# Patient Record
Sex: Female | Born: 1945
Health system: Southern US, Community
[De-identification: ages and names within clinical notes are randomized; demographics above are authoritative.]

## PROBLEM LIST (undated history)

## (undated) DIAGNOSIS — Z803 Family history of malignant neoplasm of breast: Secondary | ICD-10-CM

## (undated) DIAGNOSIS — L570 Actinic keratosis: Secondary | ICD-10-CM

## (undated) HISTORY — DX: Actinic keratosis: L57.0

## (undated) HISTORY — DX: Family history of malignant neoplasm of breast: Z80.3

---

## 1999-06-12 ENCOUNTER — Encounter: Admission: RE | Admit: 1999-06-12 | Discharge: 1999-06-12 | Payer: Self-pay | Admitting: Internal Medicine

## 1999-06-12 ENCOUNTER — Encounter: Payer: Self-pay | Admitting: Internal Medicine

## 1999-08-24 ENCOUNTER — Other Ambulatory Visit: Admission: RE | Admit: 1999-08-24 | Discharge: 1999-08-24 | Payer: Self-pay | Admitting: Internal Medicine

## 2001-05-01 ENCOUNTER — Encounter: Payer: Self-pay | Admitting: Internal Medicine

## 2001-05-01 ENCOUNTER — Encounter: Admission: RE | Admit: 2001-05-01 | Discharge: 2001-05-01 | Payer: Self-pay | Admitting: Internal Medicine

## 2004-02-03 ENCOUNTER — Other Ambulatory Visit: Admission: RE | Admit: 2004-02-03 | Discharge: 2004-02-03 | Payer: Self-pay | Admitting: Internal Medicine

## 2004-04-20 ENCOUNTER — Emergency Department (HOSPITAL_COMMUNITY): Admission: EM | Admit: 2004-04-20 | Discharge: 2004-04-20 | Payer: Self-pay | Admitting: Emergency Medicine

## 2005-02-09 ENCOUNTER — Other Ambulatory Visit: Admission: RE | Admit: 2005-02-09 | Discharge: 2005-02-09 | Payer: Self-pay | Admitting: Internal Medicine

## 2006-03-28 ENCOUNTER — Other Ambulatory Visit: Admission: RE | Admit: 2006-03-28 | Discharge: 2006-03-28 | Payer: Self-pay | Admitting: Internal Medicine

## 2008-04-22 ENCOUNTER — Other Ambulatory Visit: Admission: RE | Admit: 2008-04-22 | Discharge: 2008-04-22 | Payer: Self-pay | Admitting: Family Medicine

## 2011-05-03 ENCOUNTER — Other Ambulatory Visit: Payer: Self-pay | Admitting: Internal Medicine

## 2011-05-03 ENCOUNTER — Other Ambulatory Visit (HOSPITAL_COMMUNITY)
Admission: RE | Admit: 2011-05-03 | Discharge: 2011-05-03 | Disposition: A | Discharge status: Home / Self Care | Payer: BC Managed Care – PPO | Source: Ambulatory Visit | Attending: Internal Medicine | Admitting: Internal Medicine

## 2011-05-03 DIAGNOSIS — Z1159 Encounter for screening for other viral diseases: Secondary | ICD-10-CM | POA: Insufficient documentation

## 2011-05-03 DIAGNOSIS — Z01419 Encounter for gynecological examination (general) (routine) without abnormal findings: Secondary | ICD-10-CM | POA: Insufficient documentation

## 2013-04-30 DIAGNOSIS — Z803 Family history of malignant neoplasm of breast: Secondary | ICD-10-CM | POA: Diagnosis not present

## 2013-04-30 DIAGNOSIS — Z1231 Encounter for screening mammogram for malignant neoplasm of breast: Secondary | ICD-10-CM | POA: Diagnosis not present

## 2013-06-10 DIAGNOSIS — M810 Age-related osteoporosis without current pathological fracture: Secondary | ICD-10-CM | POA: Diagnosis not present

## 2013-06-18 ENCOUNTER — Other Ambulatory Visit: Payer: Self-pay | Admitting: Internal Medicine

## 2013-06-18 ENCOUNTER — Other Ambulatory Visit (HOSPITAL_COMMUNITY)
Admission: RE | Admit: 2013-06-18 | Discharge: 2013-06-18 | Disposition: A | Discharge status: Home / Self Care | Payer: Medicare Other | Source: Ambulatory Visit | Attending: Internal Medicine | Admitting: Internal Medicine

## 2013-06-18 DIAGNOSIS — R7301 Impaired fasting glucose: Secondary | ICD-10-CM | POA: Diagnosis not present

## 2013-06-18 DIAGNOSIS — Z124 Encounter for screening for malignant neoplasm of cervix: Secondary | ICD-10-CM | POA: Insufficient documentation

## 2013-06-18 DIAGNOSIS — M899 Disorder of bone, unspecified: Secondary | ICD-10-CM | POA: Diagnosis not present

## 2013-06-18 DIAGNOSIS — Z0000 Encounter for general adult medical examination without abnormal findings: Secondary | ICD-10-CM | POA: Diagnosis not present

## 2013-06-18 DIAGNOSIS — Z136 Encounter for screening for cardiovascular disorders: Secondary | ICD-10-CM | POA: Diagnosis not present

## 2013-06-18 DIAGNOSIS — M949 Disorder of cartilage, unspecified: Secondary | ICD-10-CM | POA: Diagnosis not present

## 2013-06-18 DIAGNOSIS — Z1331 Encounter for screening for depression: Secondary | ICD-10-CM | POA: Diagnosis not present

## 2013-06-18 DIAGNOSIS — Z1211 Encounter for screening for malignant neoplasm of colon: Secondary | ICD-10-CM | POA: Diagnosis not present

## 2013-06-18 DIAGNOSIS — Z23 Encounter for immunization: Secondary | ICD-10-CM | POA: Diagnosis not present

## 2013-06-25 DIAGNOSIS — Z20828 Contact with and (suspected) exposure to other viral communicable diseases: Secondary | ICD-10-CM | POA: Diagnosis not present

## 2013-06-25 DIAGNOSIS — J069 Acute upper respiratory infection, unspecified: Secondary | ICD-10-CM | POA: Diagnosis not present

## 2013-07-28 ENCOUNTER — Other Ambulatory Visit: Payer: Self-pay | Admitting: Gastroenterology

## 2013-07-28 DIAGNOSIS — K5730 Diverticulosis of large intestine without perforation or abscess without bleeding: Secondary | ICD-10-CM | POA: Diagnosis not present

## 2013-07-28 DIAGNOSIS — D126 Benign neoplasm of colon, unspecified: Secondary | ICD-10-CM | POA: Diagnosis not present

## 2013-07-28 DIAGNOSIS — Z1211 Encounter for screening for malignant neoplasm of colon: Secondary | ICD-10-CM | POA: Diagnosis not present

## 2013-10-08 ENCOUNTER — Telehealth: Payer: Self-pay | Admitting: Genetic Counselor

## 2013-10-08 NOTE — Telephone Encounter (Signed)
S/W REFERRING OFFICE AND GAVE GENETIC APPT FOR 06/25 @ 9 W/GENETIC COUNSELOR.  REFERRING DR. SHAMLEFFER DX- FAM HX OF BREAST CA WELCOME PACKET MAILED.

## 2013-11-05 ENCOUNTER — Ambulatory Visit (HOSPITAL_BASED_OUTPATIENT_CLINIC_OR_DEPARTMENT_OTHER): Payer: Medicare Other | Admitting: Genetic Counselor

## 2013-11-05 ENCOUNTER — Other Ambulatory Visit: Payer: Medicare Other

## 2013-11-05 ENCOUNTER — Encounter: Payer: Self-pay | Admitting: Genetic Counselor

## 2013-11-05 DIAGNOSIS — Z803 Family history of malignant neoplasm of breast: Secondary | ICD-10-CM | POA: Diagnosis not present

## 2013-11-05 NOTE — Progress Notes (Signed)
Patient Name: Ellen Blackburn Patient Age: 68 y.o. Encounter Date: 11/05/2013  Referring Physician: Philemon Kingdom) Amedeo Kinsman, MD  Primary Care Ellen Blackburn: Ellen Blackburn, Ellen Lorenzo, MD   Ms. Ellen Blackburn, a 68 y.o. female, is being seen at the [facility] due to a family history of breast cancer.  She presents to clinic today to discuss the possibility of a hereditary predisposition to cancer and discuss whether genetic testing is warranted.  HISTORY OF PRESENT ILLNESS: Ms. Ellen Blackburn has no personal history of cancer and is in generally good health. She reports having a yearly mammogram, clinical breast exam and gynecologic exam. Her colonoscopy this year was negative for polyps. She states she had one polyp removed ~2010.  Past Medical History  Diagnosis Date  . Family history of malignant neoplasm of breast     History   Social History  . Marital Status: Married    Spouse Name: N/A    Number of Children: N/A  . Years of Education: N/A   Social History Main Topics  . Smoking status: Not on file  . Smokeless tobacco: Not on file  . Alcohol Use: Not on file  . Drug Use: Not on file  . Sexual Activity: Not on file   Other Topics Concern  . Not on file   Social History Narrative  . No narrative on file     FAMILY HISTORY:   During the visit, a 4-generation pedigree was obtained. Significant diagnoses include the following:  Family History  Problem Relation Age of Onset  . Breast cancer Mother     dx 67s; deceased 86  . Breast cancer Sister 73    currently 94  . Breast cancer Maternal Grandmother     dx age?; deceased 49s  . Breast cancer Sister 94    currently 5; intellectual disability  . Breast cancer Other 74    Niece; bilateral BC; daughter of sister w/ BC at 40    Additionally, Ms. Ellen Blackburn has a daughter (age 47). In addition to her 2 sisters with breast cancer, she has 2 other sister and 2 other brothers (ages 1s-70s) who are all  cancer-free. Her mother had a brother and 2 sisters. One of these maternal aunts may have had cancer, but the primary site in unknown. She died at age 68.  Ms. Ellen Blackburn father died at 75, cancer-free. He had 4 sisters. One may have had lung cancer.  Ms. Ellen Blackburn ancestry is [place]. There is no known Jewish ancestry and no consanguinity.  ASSESSMENT AND PLAN: Ms. Ellen Blackburn is a 68 y.o. female with a family history of breast cancer. While there are reports of breast cancers in 4 generations, this history is not highly suggestive of a hereditary predisposition to cancer due to the ages of diagnosis of her sisters, mother and maternal grandmother. The only early age-onset breast cancer is in her niece. It is unclear whether this niece has a paternal family history of cancer contributing to her genetic risk. We also explained that the fact that Ms. Ellen Blackburn is 68 years old and cancer-free is reassuring and lowers the likelihood significantly that she has an inherited breast cancer risk. We reviewed the characteristics, features and inheritance patterns of hereditary cancer syndromes. We also discussed genetic testing, including the appropriate family members to test, the process of testing, insurance coverage and implications of results.   Ms. Ellen Blackburn wished to pursue genetic testing and a blood sample will be sent to [facility] for analysis of the BRCA1,  BRCA2 and PALB2 genes. This lab was selected since her insurance is unlikely to cover testing costs and this lab will not balance-bill her. We discussed the implications of a positive, negative and/ or Variant of Uncertain Significance (VUS) result. Results should be available in approximately 3 weeks, at which point we will contact her and address implications for her as well as address genetic testing for at-risk family members, if needed.    Regardless of the results of Ms. Ellen Blackburn's test, we recommended her niece, who was diagnosed with breast cancer  at age 7, have genetic counseling and testing. We discussed that it is always most informative to initiate genetic testing in a family member diagnosed with cancer in that it can sometimes help [place] better interpret results for unaffected family members. Ms. Ellen Blackburn will speak with this family member and let us know if we can be of any assistance in coordinating genetic counseling and/or testing. In the meantime, we recommended Ms. Ellen Blackburn continue to follow the cancer screening guidelines given to he by her primary Ellen Blackburn.  We encouraged Ms. Ellen Blackburn to remain in contact with Cancer Genetics annually so that we can update the family history and inform her of any changes in cancer genetics and testing that may be of benefit for this family. Ms.  Ellen Blackburn questions were answered to her satisfaction today.   Thank you for the referral and allowing [place] to share in the care of your patient.   The patient was seen for a total of 30 minutes, greater than 50% of which was spent face-to-face counseling. This patient was discussed with the overseeing Jye Fariss who agrees with the above.

## 2013-11-17 DIAGNOSIS — L578 Other skin changes due to chronic exposure to nonionizing radiation: Secondary | ICD-10-CM | POA: Diagnosis not present

## 2013-11-17 DIAGNOSIS — L820 Inflamed seborrheic keratosis: Secondary | ICD-10-CM | POA: Diagnosis not present

## 2013-11-17 DIAGNOSIS — L570 Actinic keratosis: Secondary | ICD-10-CM | POA: Diagnosis not present

## 2013-11-17 DIAGNOSIS — Z85828 Personal history of other malignant neoplasm of skin: Secondary | ICD-10-CM | POA: Diagnosis not present

## 2013-11-17 DIAGNOSIS — L821 Other seborrheic keratosis: Secondary | ICD-10-CM | POA: Diagnosis not present

## 2013-11-23 ENCOUNTER — Encounter: Payer: Self-pay | Admitting: Genetic Counselor

## 2013-11-23 NOTE — Progress Notes (Signed)
Referring Physician: Abby Jaralla, MD   Ms. Wieczorek was called today to discuss genetic test results. Please see the Genetics note from her visit on 11/05/13 for a detailed discussion of her personal and family history.  GENETIC TESTING: At the time of Ms. Quesada's visit, we recommended she pursue genetic testing of the BRCA1, BRCA2 and PALB2 genes. This test, which included sequencing and deletion/duplication analysis, was performed at [facility] due to insurance reasons. Testing was normal and did not reveal a mutation in these genes.   We discussed with Ms. Kontz that since the current test is not perfect, it is possible there may be a gene mutation that current testing cannot detect, but that chance is small. We also discussed that it is possible that a different genetic factor, which was not part of this testing or has not yet been discovered, is responsible for the cancer diagnoses in the family. Should Ms. Milstein wish to discuss or pursue this additional testing, we are happy to coordinate this at any time, but do not feel that she is at significant risk of harboring a mutation in a different gene. What is most likely is that there is a detectable mutation in her family that she did not inherit given that she is cancer-free at age 67.   CANCER SCREENING: This normal result is generally reassuring and indicates that Ms. Krantz does not likely have an increased risk of cancer due to a mutation in one of these genes. She is aware that her risk is elevated above the baseline population risk due to her family history. We recommended Ms. Tangredi continue to follow the cancer screening guidelines provided by her primary physician.   FAMILY MEMBERS: While these results are reassuring for Ms. Saladin, this test does not tell us anything about Ms. Neumeister's sisters' genetic status. We recommended these relatives, and her niece who had bilateral breast cancer in her 40s, also have genetic counseling and  testing. Please let us know if we can help facilitate testing. Genetic counselors can be located in other cities, by visiting the website of the National Society of Genetic Counselors (www.nsgc.org) and searching for a genetic counselor by zip code.    Lastly, we discussed with Ms. Eastmond that cancer genetics is a rapidly advancing field and it is possible that new genetic tests will be appropriate for her in the future. We encouraged her to remain in contact with us on an annual basis so we can update her personal and family histories, and let her know of advances in cancer genetics that may benefit the family. Our contact number was provided. Ms. Noboa's questions were answered to her satisfaction today, and she knows she is welcome to call anytime with additional questions.    Ofri Leitner, MS, CGC Certified Genetic Counseor phone: [phone] [email or web address] 

## 2014-03-03 DIAGNOSIS — L570 Actinic keratosis: Secondary | ICD-10-CM | POA: Diagnosis not present

## 2014-03-03 DIAGNOSIS — L820 Inflamed seborrheic keratosis: Secondary | ICD-10-CM | POA: Diagnosis not present

## 2014-03-03 DIAGNOSIS — L578 Other skin changes due to chronic exposure to nonionizing radiation: Secondary | ICD-10-CM | POA: Diagnosis not present

## 2014-03-03 DIAGNOSIS — L821 Other seborrheic keratosis: Secondary | ICD-10-CM | POA: Diagnosis not present

## 2014-03-03 DIAGNOSIS — Z85828 Personal history of other malignant neoplasm of skin: Secondary | ICD-10-CM | POA: Diagnosis not present

## 2014-05-03 DIAGNOSIS — Z1231 Encounter for screening mammogram for malignant neoplasm of breast: Secondary | ICD-10-CM | POA: Diagnosis not present

## 2014-06-21 DIAGNOSIS — H43811 Vitreous degeneration, right eye: Secondary | ICD-10-CM | POA: Diagnosis not present

## 2014-06-21 DIAGNOSIS — H04122 Dry eye syndrome of left lacrimal gland: Secondary | ICD-10-CM | POA: Diagnosis not present

## 2014-06-21 DIAGNOSIS — H524 Presbyopia: Secondary | ICD-10-CM | POA: Diagnosis not present

## 2014-06-21 DIAGNOSIS — H5203 Hypermetropia, bilateral: Secondary | ICD-10-CM | POA: Diagnosis not present

## 2014-07-13 DIAGNOSIS — J069 Acute upper respiratory infection, unspecified: Secondary | ICD-10-CM | POA: Diagnosis not present

## 2014-07-20 DIAGNOSIS — E785 Hyperlipidemia, unspecified: Secondary | ICD-10-CM | POA: Diagnosis not present

## 2014-07-20 DIAGNOSIS — Z1389 Encounter for screening for other disorder: Secondary | ICD-10-CM | POA: Diagnosis not present

## 2014-07-20 DIAGNOSIS — R05 Cough: Secondary | ICD-10-CM | POA: Diagnosis not present

## 2014-07-20 DIAGNOSIS — Z0000 Encounter for general adult medical examination without abnormal findings: Secondary | ICD-10-CM | POA: Diagnosis not present

## 2014-07-20 DIAGNOSIS — Z23 Encounter for immunization: Secondary | ICD-10-CM | POA: Diagnosis not present

## 2014-08-21 DIAGNOSIS — J208 Acute bronchitis due to other specified organisms: Secondary | ICD-10-CM | POA: Diagnosis not present

## 2014-09-01 DIAGNOSIS — L820 Inflamed seborrheic keratosis: Secondary | ICD-10-CM | POA: Diagnosis not present

## 2014-09-01 DIAGNOSIS — L821 Other seborrheic keratosis: Secondary | ICD-10-CM | POA: Diagnosis not present

## 2014-09-01 DIAGNOSIS — Z85828 Personal history of other malignant neoplasm of skin: Secondary | ICD-10-CM | POA: Diagnosis not present

## 2014-09-01 DIAGNOSIS — L578 Other skin changes due to chronic exposure to nonionizing radiation: Secondary | ICD-10-CM | POA: Diagnosis not present

## 2014-09-01 DIAGNOSIS — L570 Actinic keratosis: Secondary | ICD-10-CM | POA: Diagnosis not present

## 2014-09-16 ENCOUNTER — Other Ambulatory Visit: Payer: Self-pay | Admitting: Internal Medicine

## 2014-09-16 ENCOUNTER — Ambulatory Visit
Admission: RE | Admit: 2014-09-16 | Discharge: 2014-09-16 | Disposition: A | Discharge status: Home / Self Care | Payer: Medicare Other | Source: Ambulatory Visit | Attending: Internal Medicine | Admitting: Internal Medicine

## 2014-09-16 ENCOUNTER — Other Ambulatory Visit (HOSPITAL_COMMUNITY): Payer: Self-pay | Admitting: Respiratory Therapy

## 2014-09-16 DIAGNOSIS — H43812 Vitreous degeneration, left eye: Secondary | ICD-10-CM | POA: Diagnosis not present

## 2014-09-16 DIAGNOSIS — R05 Cough: Secondary | ICD-10-CM

## 2014-09-16 DIAGNOSIS — J189 Pneumonia, unspecified organism: Secondary | ICD-10-CM | POA: Diagnosis not present

## 2014-09-16 DIAGNOSIS — R053 Chronic cough: Secondary | ICD-10-CM

## 2014-09-16 DIAGNOSIS — R0602 Shortness of breath: Secondary | ICD-10-CM | POA: Diagnosis not present

## 2014-09-16 DIAGNOSIS — R059 Cough, unspecified: Secondary | ICD-10-CM

## 2014-09-16 DIAGNOSIS — H43392 Other vitreous opacities, left eye: Secondary | ICD-10-CM | POA: Diagnosis not present

## 2014-09-30 ENCOUNTER — Ambulatory Visit (HOSPITAL_COMMUNITY)
Admission: RE | Admit: 2014-09-30 | Discharge: 2014-09-30 | Disposition: A | Discharge status: Home / Self Care | Payer: Medicare Other | Source: Ambulatory Visit | Attending: Internal Medicine | Admitting: Internal Medicine

## 2014-09-30 DIAGNOSIS — R05 Cough: Secondary | ICD-10-CM | POA: Insufficient documentation

## 2014-09-30 MED ORDER — ALBUTEROL SULFATE (2.5 MG/3ML) 0.083% IN NEBU
2.5000 mg | INHALATION_SOLUTION | Freq: Once | RESPIRATORY_TRACT | Status: AC
  Administered 2014-09-30: 2.5 mg via RESPIRATORY_TRACT

## 2014-10-05 LAB — PULMONARY FUNCTION TEST
DL/VA % pred: 78 %
DL/VA: 3.75 ml/min/mmHg/L
DLCO UNC: 18.19 ml/min/mmHg
DLCO unc % pred: 74 %
FEF 25-75 PRE: 1.69 L/s
FEF 25-75 Post: 2.24 L/sec
FEF2575-%Change-Post: 32 %
FEF2575-%PRED-PRE: 81 %
FEF2575-%Pred-Post: 108 %
FEV1-%CHANGE-POST: 7 %
FEV1-%PRED-PRE: 94 %
FEV1-%Pred-Post: 101 %
FEV1-PRE: 2.24 L
FEV1-Post: 2.41 L
FEV1FVC-%Change-Post: 5 %
FEV1FVC-%Pred-Pre: 96 %
FEV6-%CHANGE-POST: 1 %
FEV6-%PRED-POST: 102 %
FEV6-%Pred-Pre: 100 %
FEV6-POST: 3.06 L
FEV6-PRE: 3 L
FEV6FVC-%Change-Post: 0 %
FEV6FVC-%Pred-Post: 104 %
FEV6FVC-%Pred-Pre: 104 %
FVC-%Change-Post: 1 %
FVC-%PRED-POST: 98 %
FVC-%Pred-Pre: 96 %
FVC-Post: 3.06 L
FVC-Pre: 3.01 L
POST FEV1/FVC RATIO: 79 %
Post FEV6/FVC ratio: 100 %
Pre FEV1/FVC ratio: 74 %
Pre FEV6/FVC Ratio: 100 %
RV % PRED: 130 %
RV: 2.77 L
TLC % pred: 112 %
TLC: 5.7 L

## 2014-10-07 ENCOUNTER — Other Ambulatory Visit: Payer: Self-pay | Admitting: Internal Medicine

## 2014-10-07 ENCOUNTER — Ambulatory Visit
Admission: RE | Admit: 2014-10-07 | Discharge: 2014-10-07 | Disposition: A | Discharge status: Home / Self Care | Payer: Medicare Other | Source: Ambulatory Visit | Attending: Internal Medicine | Admitting: Internal Medicine

## 2014-10-07 DIAGNOSIS — J189 Pneumonia, unspecified organism: Secondary | ICD-10-CM | POA: Diagnosis not present

## 2014-10-07 DIAGNOSIS — J320 Chronic maxillary sinusitis: Secondary | ICD-10-CM | POA: Diagnosis not present

## 2014-10-07 DIAGNOSIS — R0602 Shortness of breath: Secondary | ICD-10-CM | POA: Diagnosis not present

## 2014-10-07 DIAGNOSIS — R05 Cough: Secondary | ICD-10-CM | POA: Diagnosis not present

## 2015-03-02 DIAGNOSIS — L578 Other skin changes due to chronic exposure to nonionizing radiation: Secondary | ICD-10-CM | POA: Diagnosis not present

## 2015-03-02 DIAGNOSIS — L570 Actinic keratosis: Secondary | ICD-10-CM | POA: Diagnosis not present

## 2015-03-02 DIAGNOSIS — Z85828 Personal history of other malignant neoplasm of skin: Secondary | ICD-10-CM | POA: Diagnosis not present

## 2015-03-02 DIAGNOSIS — L820 Inflamed seborrheic keratosis: Secondary | ICD-10-CM | POA: Diagnosis not present

## 2015-05-26 DIAGNOSIS — M810 Age-related osteoporosis without current pathological fracture: Secondary | ICD-10-CM | POA: Diagnosis not present

## 2015-05-26 DIAGNOSIS — Z803 Family history of malignant neoplasm of breast: Secondary | ICD-10-CM | POA: Diagnosis not present

## 2015-05-26 DIAGNOSIS — Z1231 Encounter for screening mammogram for malignant neoplasm of breast: Secondary | ICD-10-CM | POA: Diagnosis not present

## 2015-06-24 DIAGNOSIS — M810 Age-related osteoporosis without current pathological fracture: Secondary | ICD-10-CM | POA: Diagnosis not present

## 2015-06-30 DIAGNOSIS — M810 Age-related osteoporosis without current pathological fracture: Secondary | ICD-10-CM | POA: Diagnosis not present

## 2015-07-22 DIAGNOSIS — Z0000 Encounter for general adult medical examination without abnormal findings: Secondary | ICD-10-CM | POA: Diagnosis not present

## 2015-07-22 DIAGNOSIS — E785 Hyperlipidemia, unspecified: Secondary | ICD-10-CM | POA: Diagnosis not present

## 2015-07-22 DIAGNOSIS — M810 Age-related osteoporosis without current pathological fracture: Secondary | ICD-10-CM | POA: Diagnosis not present

## 2015-07-22 DIAGNOSIS — Z1389 Encounter for screening for other disorder: Secondary | ICD-10-CM | POA: Diagnosis not present

## 2015-11-01 DIAGNOSIS — L578 Other skin changes due to chronic exposure to nonionizing radiation: Secondary | ICD-10-CM | POA: Diagnosis not present

## 2015-11-01 DIAGNOSIS — L570 Actinic keratosis: Secondary | ICD-10-CM | POA: Diagnosis not present

## 2015-11-01 DIAGNOSIS — Z85828 Personal history of other malignant neoplasm of skin: Secondary | ICD-10-CM | POA: Diagnosis not present

## 2015-12-28 DIAGNOSIS — M810 Age-related osteoporosis without current pathological fracture: Secondary | ICD-10-CM | POA: Diagnosis not present

## 2015-12-28 DIAGNOSIS — Z79899 Other long term (current) drug therapy: Secondary | ICD-10-CM | POA: Diagnosis not present

## 2015-12-28 DIAGNOSIS — E785 Hyperlipidemia, unspecified: Secondary | ICD-10-CM | POA: Diagnosis not present

## 2016-02-03 DIAGNOSIS — N390 Urinary tract infection, site not specified: Secondary | ICD-10-CM | POA: Diagnosis not present

## 2016-03-08 DIAGNOSIS — Z23 Encounter for immunization: Secondary | ICD-10-CM | POA: Diagnosis not present

## 2016-03-28 DIAGNOSIS — E785 Hyperlipidemia, unspecified: Secondary | ICD-10-CM | POA: Diagnosis not present

## 2016-03-28 DIAGNOSIS — M810 Age-related osteoporosis without current pathological fracture: Secondary | ICD-10-CM | POA: Diagnosis not present

## 2016-03-28 DIAGNOSIS — E559 Vitamin D deficiency, unspecified: Secondary | ICD-10-CM | POA: Diagnosis not present

## 2016-06-07 DIAGNOSIS — L821 Other seborrheic keratosis: Secondary | ICD-10-CM | POA: Diagnosis not present

## 2016-06-07 DIAGNOSIS — Z85828 Personal history of other malignant neoplasm of skin: Secondary | ICD-10-CM | POA: Diagnosis not present

## 2016-06-07 DIAGNOSIS — L578 Other skin changes due to chronic exposure to nonionizing radiation: Secondary | ICD-10-CM | POA: Diagnosis not present

## 2016-06-07 DIAGNOSIS — H5203 Hypermetropia, bilateral: Secondary | ICD-10-CM | POA: Diagnosis not present

## 2016-06-07 DIAGNOSIS — L812 Freckles: Secondary | ICD-10-CM | POA: Diagnosis not present

## 2016-06-07 DIAGNOSIS — L570 Actinic keratosis: Secondary | ICD-10-CM | POA: Diagnosis not present

## 2016-06-07 DIAGNOSIS — H43812 Vitreous degeneration, left eye: Secondary | ICD-10-CM | POA: Diagnosis not present

## 2016-06-07 DIAGNOSIS — H2513 Age-related nuclear cataract, bilateral: Secondary | ICD-10-CM | POA: Diagnosis not present

## 2016-06-11 DIAGNOSIS — Z1231 Encounter for screening mammogram for malignant neoplasm of breast: Secondary | ICD-10-CM | POA: Diagnosis not present

## 2016-06-11 DIAGNOSIS — Z803 Family history of malignant neoplasm of breast: Secondary | ICD-10-CM | POA: Diagnosis not present

## 2016-07-03 DIAGNOSIS — J069 Acute upper respiratory infection, unspecified: Secondary | ICD-10-CM | POA: Diagnosis not present

## 2016-07-03 DIAGNOSIS — J019 Acute sinusitis, unspecified: Secondary | ICD-10-CM | POA: Diagnosis not present

## 2016-07-10 DIAGNOSIS — M810 Age-related osteoporosis without current pathological fracture: Secondary | ICD-10-CM | POA: Diagnosis not present

## 2016-07-23 DIAGNOSIS — M179 Osteoarthritis of knee, unspecified: Secondary | ICD-10-CM | POA: Diagnosis not present

## 2016-07-23 DIAGNOSIS — E559 Vitamin D deficiency, unspecified: Secondary | ICD-10-CM | POA: Diagnosis not present

## 2016-07-23 DIAGNOSIS — I7300 Raynaud's syndrome without gangrene: Secondary | ICD-10-CM | POA: Diagnosis not present

## 2016-07-23 DIAGNOSIS — M810 Age-related osteoporosis without current pathological fracture: Secondary | ICD-10-CM | POA: Diagnosis not present

## 2016-07-23 DIAGNOSIS — Z1389 Encounter for screening for other disorder: Secondary | ICD-10-CM | POA: Diagnosis not present

## 2016-07-23 DIAGNOSIS — E785 Hyperlipidemia, unspecified: Secondary | ICD-10-CM | POA: Diagnosis not present

## 2016-07-23 DIAGNOSIS — Z0000 Encounter for general adult medical examination without abnormal findings: Secondary | ICD-10-CM | POA: Diagnosis not present

## 2016-08-08 DIAGNOSIS — R1030 Lower abdominal pain, unspecified: Secondary | ICD-10-CM | POA: Diagnosis not present

## 2016-08-08 DIAGNOSIS — W19XXXA Unspecified fall, initial encounter: Secondary | ICD-10-CM | POA: Diagnosis not present

## 2016-10-24 DIAGNOSIS — E785 Hyperlipidemia, unspecified: Secondary | ICD-10-CM | POA: Diagnosis not present

## 2016-10-24 DIAGNOSIS — L539 Erythematous condition, unspecified: Secondary | ICD-10-CM | POA: Diagnosis not present

## 2016-10-24 DIAGNOSIS — W57XXXA Bitten or stung by nonvenomous insect and other nonvenomous arthropods, initial encounter: Secondary | ICD-10-CM | POA: Diagnosis not present

## 2016-10-26 IMAGING — CR DG CHEST 2V
2 series · 2 of 2 positions shown · non-contrast
Comparison: CTA chest dated 04/20/2004

CLINICAL DATA: Chronic cough x2 months, shortness of breath

EXAM:
CHEST  2 VIEW

[w chest pa]
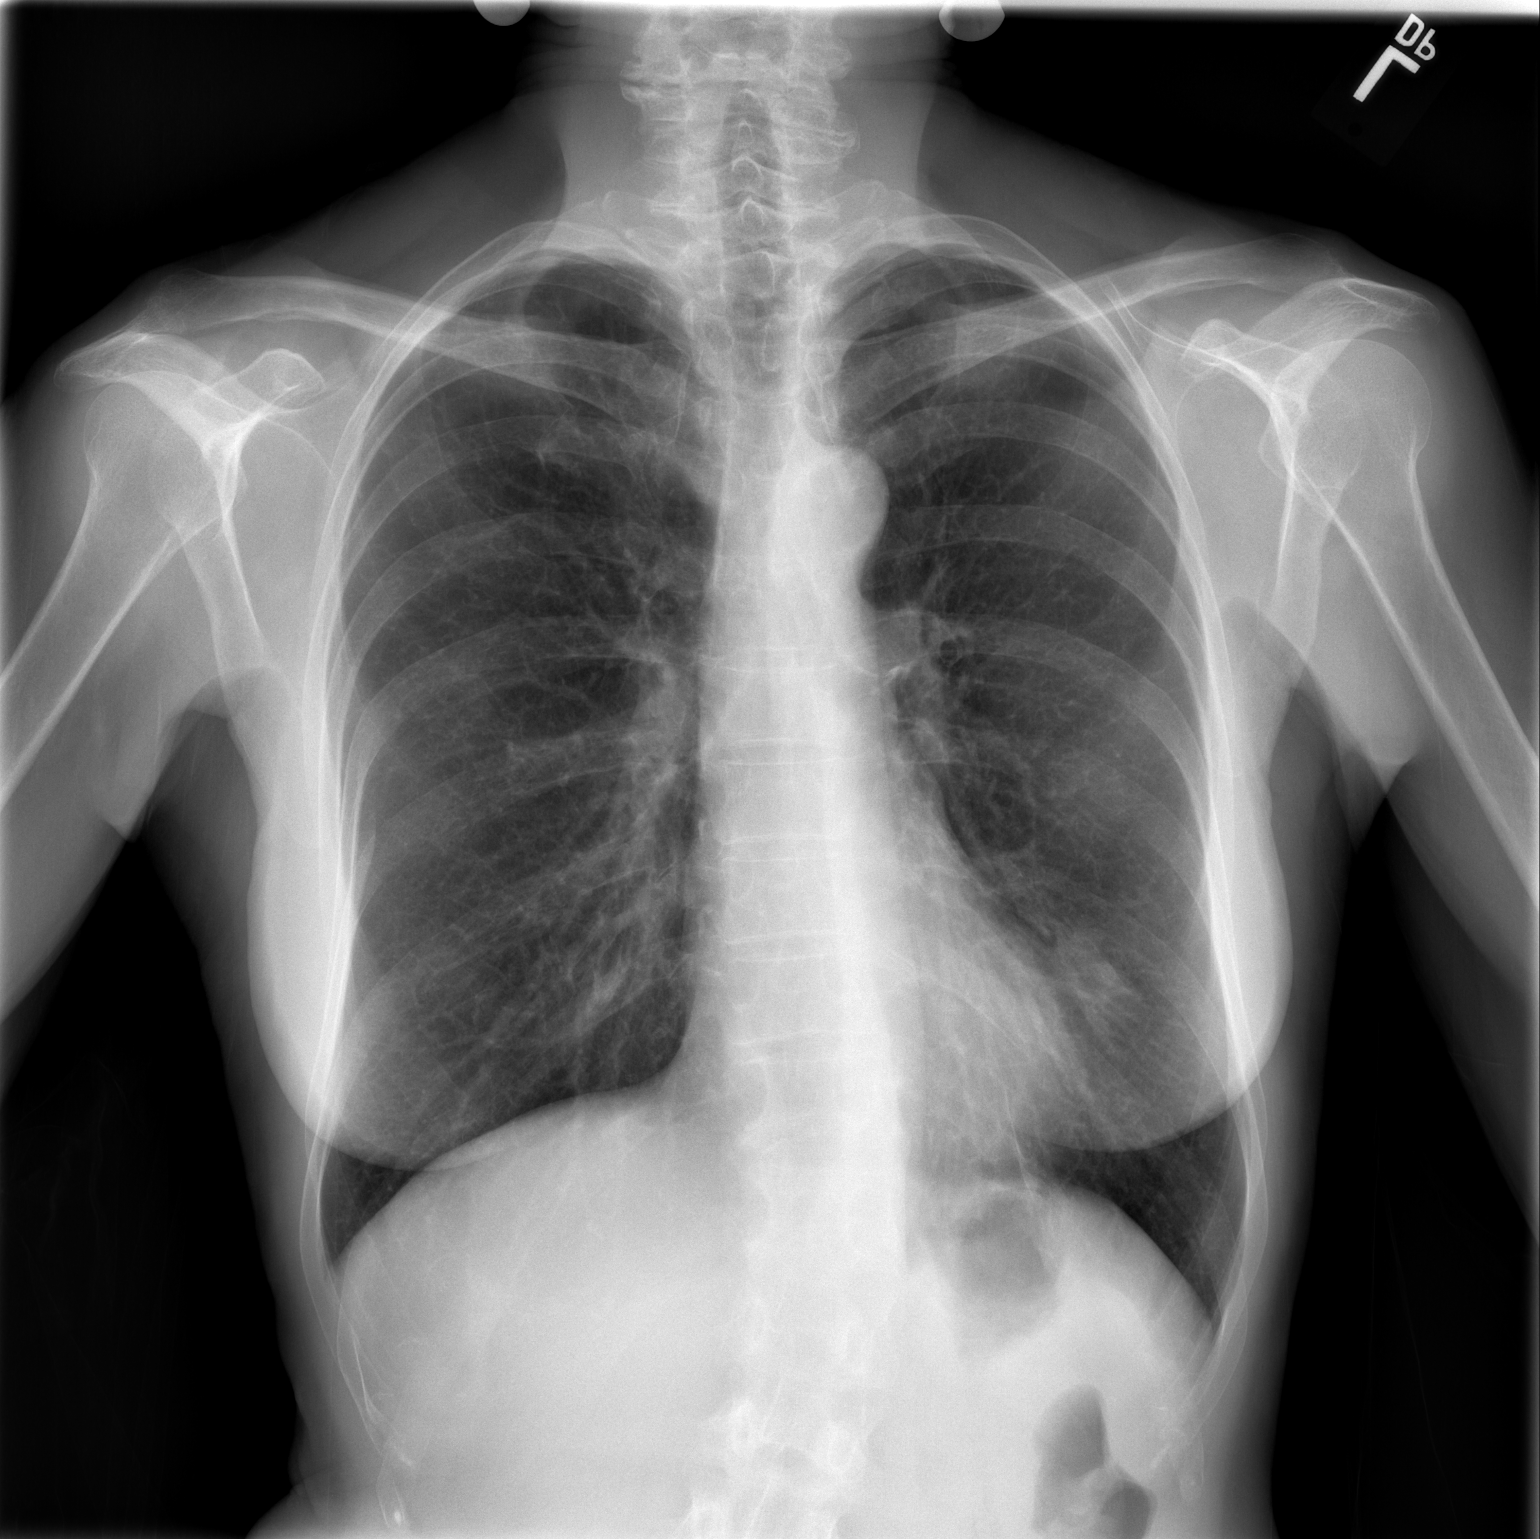

[w chest lat]
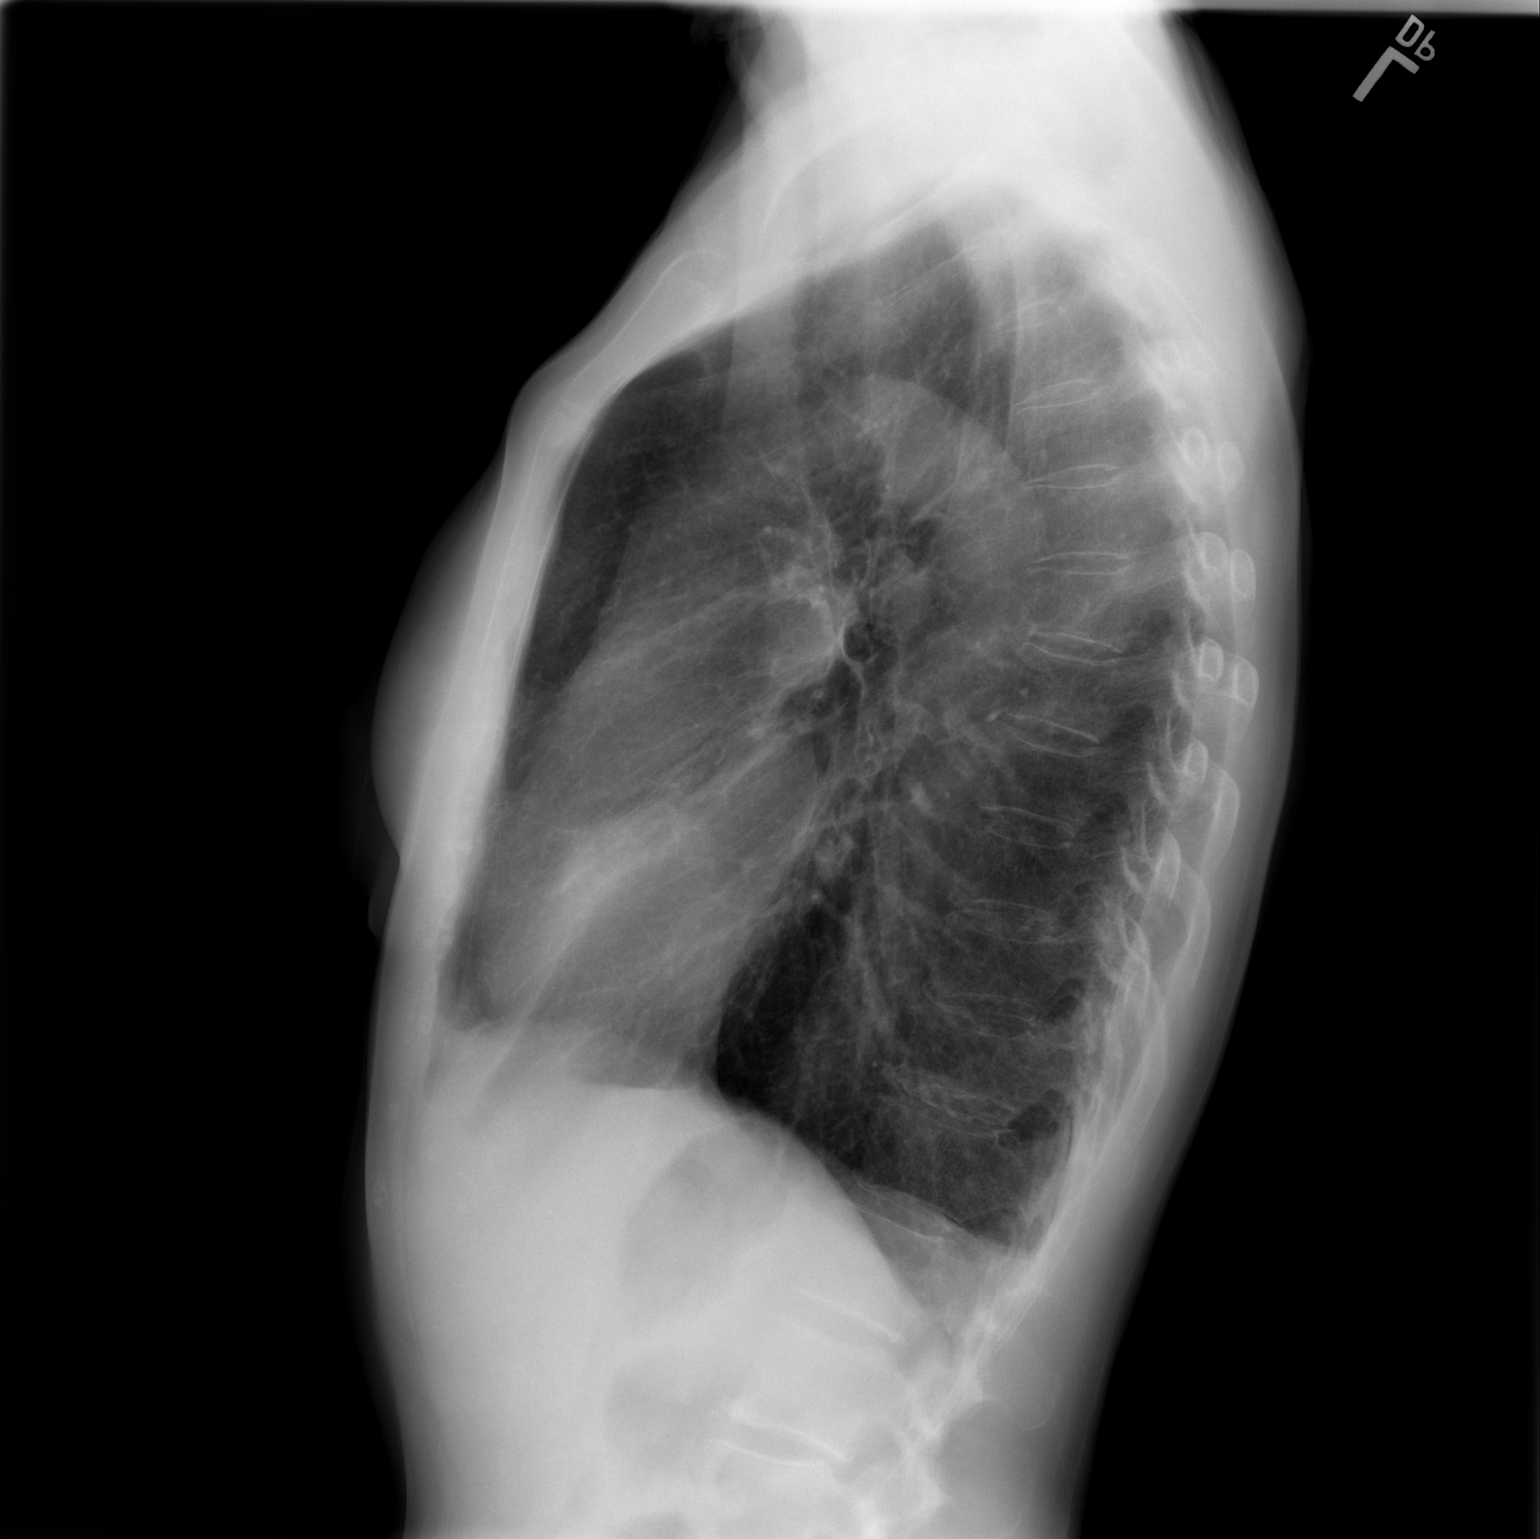

[2 of 2 positions shown; findings below may reference images not displayed]

FINDINGS: Patchy/ nodular lingular opacity, possibly infectious.

Lungs otherwise clear.  No pleural effusion or pneumothorax.

The heart is normal in size.

Visualized osseous structures are within normal limits.
IMPRESSION: Patchy/nodular lingular opacity, suspicious for pneumonia in the
appropriate clinical setting. Consider follow-up chest radiographs
in 3-4 weeks. If persistent, consider CT chest for further
evaluation.

## 2016-11-29 DIAGNOSIS — M25561 Pain in right knee: Secondary | ICD-10-CM | POA: Diagnosis not present

## 2016-11-29 DIAGNOSIS — M7631 Iliotibial band syndrome, right leg: Secondary | ICD-10-CM | POA: Diagnosis not present

## 2016-12-19 DIAGNOSIS — L578 Other skin changes due to chronic exposure to nonionizing radiation: Secondary | ICD-10-CM | POA: Diagnosis not present

## 2016-12-19 DIAGNOSIS — L821 Other seborrheic keratosis: Secondary | ICD-10-CM | POA: Diagnosis not present

## 2016-12-19 DIAGNOSIS — D229 Melanocytic nevi, unspecified: Secondary | ICD-10-CM | POA: Diagnosis not present

## 2016-12-19 DIAGNOSIS — Z85828 Personal history of other malignant neoplasm of skin: Secondary | ICD-10-CM | POA: Diagnosis not present

## 2016-12-19 DIAGNOSIS — L570 Actinic keratosis: Secondary | ICD-10-CM | POA: Diagnosis not present

## 2016-12-19 DIAGNOSIS — D692 Other nonthrombocytopenic purpura: Secondary | ICD-10-CM | POA: Diagnosis not present

## 2016-12-19 DIAGNOSIS — D1800 Hemangioma unspecified site: Secondary | ICD-10-CM | POA: Diagnosis not present

## 2016-12-19 DIAGNOSIS — L812 Freckles: Secondary | ICD-10-CM | POA: Diagnosis not present

## 2016-12-19 DIAGNOSIS — Z1283 Encounter for screening for malignant neoplasm of skin: Secondary | ICD-10-CM | POA: Diagnosis not present

## 2017-01-10 DIAGNOSIS — M810 Age-related osteoporosis without current pathological fracture: Secondary | ICD-10-CM | POA: Diagnosis not present

## 2017-01-10 DIAGNOSIS — M65331 Trigger finger, right middle finger: Secondary | ICD-10-CM | POA: Diagnosis not present

## 2017-01-10 DIAGNOSIS — R350 Frequency of micturition: Secondary | ICD-10-CM | POA: Diagnosis not present

## 2017-01-10 DIAGNOSIS — M1711 Unilateral primary osteoarthritis, right knee: Secondary | ICD-10-CM | POA: Diagnosis not present

## 2017-01-22 DIAGNOSIS — M1711 Unilateral primary osteoarthritis, right knee: Secondary | ICD-10-CM | POA: Diagnosis not present

## 2017-01-29 DIAGNOSIS — Z23 Encounter for immunization: Secondary | ICD-10-CM | POA: Diagnosis not present

## 2017-01-29 DIAGNOSIS — M1711 Unilateral primary osteoarthritis, right knee: Secondary | ICD-10-CM | POA: Diagnosis not present

## 2017-02-08 DIAGNOSIS — M1711 Unilateral primary osteoarthritis, right knee: Secondary | ICD-10-CM | POA: Diagnosis not present

## 2017-02-22 ENCOUNTER — Encounter (HOSPITAL_COMMUNITY): Payer: Self-pay | Admitting: *Deleted

## 2017-02-22 ENCOUNTER — Other Ambulatory Visit: Payer: Self-pay | Admitting: Internal Medicine

## 2017-02-22 ENCOUNTER — Emergency Department (HOSPITAL_COMMUNITY)
Admission: EM | Admit: 2017-02-22 | Discharge: 2017-02-22 | Disposition: A | Discharge status: Home / Self Care | Payer: Medicare Other | Attending: Emergency Medicine | Admitting: Emergency Medicine

## 2017-02-22 ENCOUNTER — Emergency Department (HOSPITAL_COMMUNITY): Payer: Medicare Other

## 2017-02-22 ENCOUNTER — Ambulatory Visit
Admission: RE | Admit: 2017-02-22 | Discharge: 2017-02-22 | Disposition: A | Discharge status: Home / Self Care | Payer: Medicare Other | Source: Ambulatory Visit | Attending: Internal Medicine | Admitting: Internal Medicine

## 2017-02-22 DIAGNOSIS — W01198A Fall on same level from slipping, tripping and stumbling with subsequent striking against other object, initial encounter: Secondary | ICD-10-CM | POA: Insufficient documentation

## 2017-02-22 DIAGNOSIS — R52 Pain, unspecified: Secondary | ICD-10-CM

## 2017-02-22 DIAGNOSIS — M25511 Pain in right shoulder: Secondary | ICD-10-CM | POA: Insufficient documentation

## 2017-02-22 DIAGNOSIS — M25531 Pain in right wrist: Secondary | ICD-10-CM | POA: Diagnosis not present

## 2017-02-22 DIAGNOSIS — S6991XA Unspecified injury of right wrist, hand and finger(s), initial encounter: Secondary | ICD-10-CM | POA: Diagnosis not present

## 2017-02-22 DIAGNOSIS — Z79899 Other long term (current) drug therapy: Secondary | ICD-10-CM | POA: Insufficient documentation

## 2017-02-22 DIAGNOSIS — Y929 Unspecified place or not applicable: Secondary | ICD-10-CM | POA: Diagnosis not present

## 2017-02-22 DIAGNOSIS — S62114A Nondisplaced fracture of triquetrum [cuneiform] bone, right wrist, initial encounter for closed fracture: Secondary | ICD-10-CM | POA: Diagnosis not present

## 2017-02-22 DIAGNOSIS — M7989 Other specified soft tissue disorders: Secondary | ICD-10-CM | POA: Diagnosis not present

## 2017-02-22 DIAGNOSIS — S62111A Displaced fracture of triquetrum [cuneiform] bone, right wrist, initial encounter for closed fracture: Secondary | ICD-10-CM | POA: Diagnosis not present

## 2017-02-22 DIAGNOSIS — W19XXXA Unspecified fall, initial encounter: Secondary | ICD-10-CM | POA: Diagnosis not present

## 2017-02-22 DIAGNOSIS — Y998 Other external cause status: Secondary | ICD-10-CM | POA: Diagnosis not present

## 2017-02-22 DIAGNOSIS — Y939 Activity, unspecified: Secondary | ICD-10-CM | POA: Insufficient documentation

## 2017-02-22 MED ORDER — OXYCODONE-ACETAMINOPHEN 5-325 MG PO TABS
1.0000 | ORAL_TABLET | Freq: Once | ORAL | Status: AC
  Administered 2017-02-22: 1 via ORAL
  Filled 2017-02-22: qty 1

## 2017-02-22 MED ORDER — OXYCODONE-ACETAMINOPHEN 5-325 MG PO TABS: 1.0000 | ORAL_TABLET | Freq: Four times a day (QID) | ORAL | 0 refills | Status: DC | PRN

## 2017-02-22 NOTE — ED Triage Notes (Signed)
Pt reports falling last night landing on concrete, pt seen by PCP today with xrays revealing multiple R wrist fractures, recommended to come here by [facility] office to be seen, pt denies hitting head & LOC, denies taking blood thinners, A&O x4

## 2017-02-22 NOTE — Progress Notes (Signed)
Orthopedic Tech Progress Note Patient Details:  Ellen Blackburn 12-03-1945 [id number]  Ortho Devices Type of Ortho Device: Sugartong splint, Arm sling Ortho Device/Splint Location: Right Ortho Device/Splint Interventions: Application, Adjustment   Kristopher Oppenheim 02/22/2017, 6:50 PM

## 2017-02-22 NOTE — ED Notes (Signed)
Pt reports falling last night on wet ground. Was seen today and got Xrays. Pt states that her doctor told her she has multiple breaks.

## 2017-02-22 NOTE — ED Provider Notes (Signed)
[facility] DEPT Provider Note   CSN: [id number] Arrival date & time: 02/22/17  1329     History   Chief Complaint Chief Complaint  Patient presents with  . Wrist Pain    HPI Ellen Blackburn is a 71 y.o. female.  HPI Patient presents with right wrist pain after a fall last night. Sent in after being seen by [facility] clinic today. X-ray shows possible carpal fractures. Reportedly had discussed with Dr. Levell July office from hand surgery and was told to come in the ER. She slipped on something wet and landed on her right side. Broke her fall with her right hand. She is not on anticoagulation. She did eat some food out of the vending machine while she was waiting in the waiting room. Past Medical History:  Diagnosis Date  . Family history of malignant neoplasm of breast     Patient Active Problem List   Diagnosis Date Noted  . Family history of malignant neoplasm of breast     History reviewed. No pertinent surgical history.  OB History    No data available       Home Medications    Prior to Admission medications   Medication Sig Start Date End Date Taking? Authorizing Provider  Ascorbic Acid (VITAMIN C PO) Take 1 tablet by mouth daily.   Yes [provider]  Chlorpheniramine-Pseudoeph (ALLEREST MAXIMUM STRENGTH PO) Take 1 tablet by mouth daily.   Yes [provider]  Cholecalciferol (VITAMIN D PO) Take 1 capsule by mouth daily.   Yes [provider]  Omega-3 Fatty Acids (FISH OIL PO) Take 1 capsule by mouth daily.   Yes [provider]  simvastatin (ZOCOR) 10 MG tablet Take 10 mg by mouth daily as needed (cholesterol).   Yes [provider]  TURMERIC PO Take 1 capsule by mouth daily.   Yes [provider]  oxyCODONE-acetaminophen (PERCOCET/ROXICET) 5-325 MG tablet Take 1-2 tablets by mouth every 6 (six) hours as needed. 02/22/17   Davonna Belling, MD    Family History Family History  Problem Relation Age of  Onset  . Breast cancer Mother        dx 23s; deceased 50  . Breast cancer Sister 91       currently 34  . Breast cancer Maternal Grandmother        dx age?; deceased 39s  . Breast cancer Sister 103       currently 49; intellectual disability  . Breast cancer Other 37       Niece; bilateral BC; daughter of sister w/ BC at 26    Social History Social History  Substance Use Topics  . Smoking status: Never Smoker  . Smokeless tobacco: Never Used  . Alcohol use 4.2 oz/week    7 Glasses of wine per week     Allergies   Patient has no known allergies.   Review of Systems Review of Systems  Constitutional: Negative for appetite change.  Respiratory: Negative for shortness of breath.   Musculoskeletal: Negative for back pain and neck pain.       Right wrist and right shoulder pain.  Skin: Negative for wound.  Neurological: Negative for weakness and numbness.     Physical Exam Updated Vital Signs BP 110/71   Pulse (!) 53   Temp 97.6 F (36.4 C) (Oral)   Resp 18   Ht 5\' 4"  (1.626 m)   Wt 51.7 kg (114 lb)   SpO2 100%   BMI 19.57 kg/m  Physical Exam  Constitutional: She appears well-developed.  HENT:  Head: Atraumatic.  Eyes: Pupils are equal, round, and reactive to light.  Cardiovascular: Normal rate.   Musculoskeletal:  Minimal tenderness to right shoulder with good range of motion. No tenderness over elbow. Swelling on the dorsal aspect of the right wrist. Patient is in a Velcro splint. Neurovascular intact in fingers. Skin intact. Good capillary refill.  Neurological: She is alert.  Skin: Skin is warm. Capillary refill takes less than 2 seconds.     ED Treatments / Results  Labs (all labs ordered are listed, but only abnormal results are displayed) Labs Reviewed - No data to display  EKG  EKG Interpretation None       Radiology Dg Wrist Complete Right  Result Date: 02/22/2017 CLINICAL DATA:  Pt fell last night on concrete, pain and swelling  carpal bones EXAM: RIGHT WRIST - COMPLETE 3+ VIEW COMPARISON:  None. FINDINGS: There are several bony fragments versus soft tissue calcifications which project over the dorsal aspect of the carpus. There is associated dorsal soft tissue swelling. If these are fractures, the site of the fracture is not defined on these images. The dorsal triquetrum fracture as a component of this is the most likely. Alternatively, this may reflect nonspecific, chronic, soft tissue calcification. The soft tissue swelling with suggests that there is at least a component of acute fracture. No other evidence of a fracture. The joints are normally spaced and aligned with no significant arthropathic change. No other soft tissue abnormality. IMPRESSION: 1. Multiple bone fragments versus areas of soft tissue calcification along the dorsal margin of the carpus with associated dorsal soft tissue swelling. Given the history of the recent fall, suspect at least 1 of these is a dorsal triquetrum avulsion fracture. 2. No other evidence of a fracture.  No dislocation. Electronically Signed   By: Lajean Manes M.D.   On: 02/22/2017 12:38   Ct Wrist Right Wo Contrast  Result Date: 02/22/2017 CLINICAL DATA:  Right wrist pain and swelling since a fall last night. Initial encounter. EXAM: CT OF THE RIGHT WRIST WITHOUT CONTRAST TECHNIQUE: Multidetector CT imaging of the right wrist was performed according to the standard protocol. Multiplanar CT image reconstructions were also generated. COMPARISON:  Plain films right wrist 02/22/2017. FINDINGS: Bones/Joint/Cartilage The patient has a chip fracture off the dorsal aspect of the triquetrum. Fracture fragment measures 0.5 cm transverse by 0.3 cm AP by 0.6 cm long. The fracture fragment is dorsally displaced 0.2 cm. No other acute fracture is identified. There is a well corticated margin between the hook of the hamate and underlying hamate likely due to congenital failure of fusion. Mild osteophytosis  and joint space narrowing first CMC joint noted. Ligaments Suboptimally assessed by CT. Muscles and Tendons Appear intact. Soft tissues There is soft tissue swelling about the wrist. Focus of calcifications over the dorsal aspect of the wrist at the level of the articulation of the lunate and triquetrum measures 0.6 cm AP by 1.2 cm transverse by 0.7 cm craniocaudal. Somewhat focal appearing fluid is seen dorsal to capitate measuring 1 cm craniocaudal by 0.5 cm AP by approximately 1.8 cm transverse. IMPRESSION: The examination is positive for an acute fracture off the dorsal aspect of the triquetrum. No other acute bony abnormality is seen. Small cluster of calcifications in the dorsum of the wrist could be calcifications about a chronic ganglion cyst which is the favored diagnosis. Tophus related to gout is also possible. Findings compatible with a small  ganglion cyst centered over the dorsum of the capitate. Electronically Signed   By: Inge Rise M.D.   On: 02/22/2017 17:47    Procedures Procedures (including critical care time)  Medications Ordered in ED Medications  oxyCODONE-acetaminophen (PERCOCET/ROXICET) 5-325 MG per tablet 1 tablet (1 tablet Oral Given 02/22/17 1636)     Initial Impression / Assessment and Plan / ED Course  I have reviewed the triage vital signs and the nursing notes.  Pertinent labs & imaging results that were available during my care of the patient were reviewed by me and considered in my medical decision making (see chart for details).    Patient with wrist fracture. Has triquetral fracture. The calcifications are likely bone fragments appear to be calcifications on a ganglion cyst on CT. I have discussed with Dr. Fredna Dow, will see the patient follow up on Monday.*  Final Clinical Impressions(s) / ED Diagnoses   Final diagnoses:  Closed nondisplaced fracture of triquetrum of right wrist, initial encounter    New Prescriptions Discharge Medication List as of  02/22/2017  6:33 PM    START taking these medications   Details  oxyCODONE-acetaminophen (PERCOCET/ROXICET) 5-325 MG tablet Take 1-2 tablets by mouth every 6 (six) hours as needed., Starting Fri 02/22/2017, Print         Davonna Belling, MD 02/23/17 0030

## 2017-02-25 DIAGNOSIS — M67431 Ganglion, right wrist: Secondary | ICD-10-CM | POA: Diagnosis not present

## 2017-02-25 DIAGNOSIS — S62111A Displaced fracture of triquetrum [cuneiform] bone, right wrist, initial encounter for closed fracture: Secondary | ICD-10-CM | POA: Diagnosis not present

## 2017-02-25 DIAGNOSIS — M25521 Pain in right elbow: Secondary | ICD-10-CM | POA: Diagnosis not present

## 2017-03-01 DIAGNOSIS — M65331 Trigger finger, right middle finger: Secondary | ICD-10-CM | POA: Diagnosis not present

## 2017-03-01 DIAGNOSIS — S62114A Nondisplaced fracture of triquetrum [cuneiform] bone, right wrist, initial encounter for closed fracture: Secondary | ICD-10-CM | POA: Diagnosis not present

## 2017-03-15 DIAGNOSIS — M25531 Pain in right wrist: Secondary | ICD-10-CM | POA: Diagnosis not present

## 2017-03-15 DIAGNOSIS — S62111S Displaced fracture of triquetrum [cuneiform] bone, right wrist, sequela: Secondary | ICD-10-CM | POA: Diagnosis not present

## 2017-03-15 DIAGNOSIS — M65331 Trigger finger, right middle finger: Secondary | ICD-10-CM | POA: Diagnosis not present

## 2017-03-20 DIAGNOSIS — M1711 Unilateral primary osteoarthritis, right knee: Secondary | ICD-10-CM | POA: Diagnosis not present

## 2017-04-29 DIAGNOSIS — M65341 Trigger finger, right ring finger: Secondary | ICD-10-CM | POA: Diagnosis not present

## 2017-04-29 DIAGNOSIS — M65331 Trigger finger, right middle finger: Secondary | ICD-10-CM | POA: Diagnosis not present

## 2017-04-29 DIAGNOSIS — M65351 Trigger finger, right little finger: Secondary | ICD-10-CM | POA: Diagnosis not present

## 2017-05-15 DIAGNOSIS — M1711 Unilateral primary osteoarthritis, right knee: Secondary | ICD-10-CM | POA: Diagnosis not present

## 2017-05-15 DIAGNOSIS — M7631 Iliotibial band syndrome, right leg: Secondary | ICD-10-CM | POA: Diagnosis not present

## 2017-05-15 DIAGNOSIS — M25561 Pain in right knee: Secondary | ICD-10-CM | POA: Diagnosis not present

## 2017-05-16 DIAGNOSIS — M79641 Pain in right hand: Secondary | ICD-10-CM | POA: Diagnosis not present

## 2017-05-29 DIAGNOSIS — M79641 Pain in right hand: Secondary | ICD-10-CM | POA: Diagnosis not present

## 2017-06-21 DIAGNOSIS — Z1231 Encounter for screening mammogram for malignant neoplasm of breast: Secondary | ICD-10-CM | POA: Diagnosis not present

## 2017-06-21 DIAGNOSIS — Z803 Family history of malignant neoplasm of breast: Secondary | ICD-10-CM | POA: Diagnosis not present

## 2017-06-24 DIAGNOSIS — M79641 Pain in right hand: Secondary | ICD-10-CM | POA: Diagnosis not present

## 2017-07-15 DIAGNOSIS — M79641 Pain in right hand: Secondary | ICD-10-CM | POA: Diagnosis not present

## 2017-07-17 DIAGNOSIS — H524 Presbyopia: Secondary | ICD-10-CM | POA: Diagnosis not present

## 2017-07-17 DIAGNOSIS — H2513 Age-related nuclear cataract, bilateral: Secondary | ICD-10-CM | POA: Diagnosis not present

## 2017-07-17 DIAGNOSIS — H5203 Hypermetropia, bilateral: Secondary | ICD-10-CM | POA: Diagnosis not present

## 2017-07-24 DIAGNOSIS — N6019 Diffuse cystic mastopathy of unspecified breast: Secondary | ICD-10-CM | POA: Diagnosis not present

## 2017-07-24 DIAGNOSIS — Z1389 Encounter for screening for other disorder: Secondary | ICD-10-CM | POA: Diagnosis not present

## 2017-07-24 DIAGNOSIS — Z0000 Encounter for general adult medical examination without abnormal findings: Secondary | ICD-10-CM | POA: Diagnosis not present

## 2017-07-24 DIAGNOSIS — E785 Hyperlipidemia, unspecified: Secondary | ICD-10-CM | POA: Diagnosis not present

## 2017-07-24 DIAGNOSIS — I7300 Raynaud's syndrome without gangrene: Secondary | ICD-10-CM | POA: Diagnosis not present

## 2017-07-24 DIAGNOSIS — M65331 Trigger finger, right middle finger: Secondary | ICD-10-CM | POA: Diagnosis not present

## 2017-07-24 DIAGNOSIS — M810 Age-related osteoporosis without current pathological fracture: Secondary | ICD-10-CM | POA: Diagnosis not present

## 2017-07-24 DIAGNOSIS — M179 Osteoarthritis of knee, unspecified: Secondary | ICD-10-CM | POA: Diagnosis not present

## 2017-07-24 DIAGNOSIS — Z136 Encounter for screening for cardiovascular disorders: Secondary | ICD-10-CM | POA: Diagnosis not present

## 2017-08-01 DIAGNOSIS — M79641 Pain in right hand: Secondary | ICD-10-CM | POA: Diagnosis not present

## 2017-08-19 DIAGNOSIS — M65341 Trigger finger, right ring finger: Secondary | ICD-10-CM | POA: Diagnosis not present

## 2017-08-19 DIAGNOSIS — M6530 Trigger finger, unspecified finger: Secondary | ICD-10-CM | POA: Diagnosis not present

## 2017-08-19 DIAGNOSIS — M65331 Trigger finger, right middle finger: Secondary | ICD-10-CM | POA: Diagnosis not present

## 2017-12-31 DIAGNOSIS — K529 Noninfective gastroenteritis and colitis, unspecified: Secondary | ICD-10-CM | POA: Diagnosis not present

## 2018-01-08 DIAGNOSIS — L570 Actinic keratosis: Secondary | ICD-10-CM | POA: Diagnosis not present

## 2018-01-08 DIAGNOSIS — L578 Other skin changes due to chronic exposure to nonionizing radiation: Secondary | ICD-10-CM | POA: Diagnosis not present

## 2018-01-08 DIAGNOSIS — L820 Inflamed seborrheic keratosis: Secondary | ICD-10-CM | POA: Diagnosis not present

## 2018-01-08 DIAGNOSIS — D2230 Melanocytic nevi of unspecified part of face: Secondary | ICD-10-CM | POA: Diagnosis not present

## 2018-01-08 DIAGNOSIS — L812 Freckles: Secondary | ICD-10-CM | POA: Diagnosis not present

## 2018-01-08 DIAGNOSIS — D225 Melanocytic nevi of trunk: Secondary | ICD-10-CM | POA: Diagnosis not present

## 2018-01-08 DIAGNOSIS — D229 Melanocytic nevi, unspecified: Secondary | ICD-10-CM | POA: Diagnosis not present

## 2018-01-08 DIAGNOSIS — Z1283 Encounter for screening for malignant neoplasm of skin: Secondary | ICD-10-CM | POA: Diagnosis not present

## 2018-01-08 DIAGNOSIS — D1800 Hemangioma unspecified site: Secondary | ICD-10-CM | POA: Diagnosis not present

## 2018-01-08 DIAGNOSIS — L821 Other seborrheic keratosis: Secondary | ICD-10-CM | POA: Diagnosis not present

## 2018-01-08 DIAGNOSIS — Z85828 Personal history of other malignant neoplasm of skin: Secondary | ICD-10-CM | POA: Diagnosis not present

## 2018-01-24 DIAGNOSIS — Z23 Encounter for immunization: Secondary | ICD-10-CM | POA: Diagnosis not present

## 2018-01-24 DIAGNOSIS — M810 Age-related osteoporosis without current pathological fracture: Secondary | ICD-10-CM | POA: Diagnosis not present

## 2018-02-05 DIAGNOSIS — M180 Bilateral primary osteoarthritis of first carpometacarpal joints: Secondary | ICD-10-CM | POA: Diagnosis not present

## 2018-02-05 DIAGNOSIS — M79641 Pain in right hand: Secondary | ICD-10-CM | POA: Diagnosis not present

## 2018-02-05 DIAGNOSIS — M79642 Pain in left hand: Secondary | ICD-10-CM | POA: Diagnosis not present

## 2018-06-27 DIAGNOSIS — Z803 Family history of malignant neoplasm of breast: Secondary | ICD-10-CM | POA: Diagnosis not present

## 2018-06-27 DIAGNOSIS — Z1231 Encounter for screening mammogram for malignant neoplasm of breast: Secondary | ICD-10-CM | POA: Diagnosis not present

## 2018-07-21 DIAGNOSIS — H5203 Hypermetropia, bilateral: Secondary | ICD-10-CM | POA: Diagnosis not present

## 2018-07-21 DIAGNOSIS — H2513 Age-related nuclear cataract, bilateral: Secondary | ICD-10-CM | POA: Diagnosis not present

## 2018-08-04 DIAGNOSIS — M810 Age-related osteoporosis without current pathological fracture: Secondary | ICD-10-CM | POA: Diagnosis not present

## 2018-08-04 DIAGNOSIS — E785 Hyperlipidemia, unspecified: Secondary | ICD-10-CM | POA: Diagnosis not present

## 2018-08-04 DIAGNOSIS — I7300 Raynaud's syndrome without gangrene: Secondary | ICD-10-CM | POA: Diagnosis not present

## 2018-08-04 DIAGNOSIS — M179 Osteoarthritis of knee, unspecified: Secondary | ICD-10-CM | POA: Diagnosis not present

## 2018-08-04 DIAGNOSIS — Z79899 Other long term (current) drug therapy: Secondary | ICD-10-CM | POA: Diagnosis not present

## 2018-08-04 DIAGNOSIS — Z1211 Encounter for screening for malignant neoplasm of colon: Secondary | ICD-10-CM | POA: Diagnosis not present

## 2018-08-04 DIAGNOSIS — Z1239 Encounter for other screening for malignant neoplasm of breast: Secondary | ICD-10-CM | POA: Diagnosis not present

## 2018-08-04 DIAGNOSIS — Z0000 Encounter for general adult medical examination without abnormal findings: Secondary | ICD-10-CM | POA: Diagnosis not present

## 2018-09-16 DIAGNOSIS — M810 Age-related osteoporosis without current pathological fracture: Secondary | ICD-10-CM | POA: Diagnosis not present

## 2018-09-16 DIAGNOSIS — Z7189 Other specified counseling: Secondary | ICD-10-CM | POA: Diagnosis not present

## 2018-09-16 DIAGNOSIS — J309 Allergic rhinitis, unspecified: Secondary | ICD-10-CM | POA: Diagnosis not present

## 2018-09-16 DIAGNOSIS — Z1331 Encounter for screening for depression: Secondary | ICD-10-CM | POA: Diagnosis not present

## 2018-10-20 DIAGNOSIS — Z96653 Presence of artificial knee joint, bilateral: Secondary | ICD-10-CM | POA: Diagnosis not present

## 2018-10-20 DIAGNOSIS — M8589 Other specified disorders of bone density and structure, multiple sites: Secondary | ICD-10-CM | POA: Diagnosis not present

## 2018-10-20 DIAGNOSIS — Z8262 Family history of osteoporosis: Secondary | ICD-10-CM | POA: Diagnosis not present

## 2019-01-06 DIAGNOSIS — Z23 Encounter for immunization: Secondary | ICD-10-CM | POA: Diagnosis not present

## 2019-02-10 DIAGNOSIS — M810 Age-related osteoporosis without current pathological fracture: Secondary | ICD-10-CM | POA: Diagnosis not present

## 2019-02-20 ENCOUNTER — Other Ambulatory Visit (HOSPITAL_COMMUNITY): Payer: Self-pay | Admitting: *Deleted

## 2019-02-23 ENCOUNTER — Ambulatory Visit (HOSPITAL_COMMUNITY)
Admission: RE | Admit: 2019-02-23 | Discharge: 2019-02-23 | Disposition: A | Discharge status: Home / Self Care | Payer: Medicare Other | Source: Ambulatory Visit | Attending: Internal Medicine | Admitting: Internal Medicine

## 2019-02-23 ENCOUNTER — Other Ambulatory Visit: Payer: Self-pay

## 2019-02-23 DIAGNOSIS — M810 Age-related osteoporosis without current pathological fracture: Secondary | ICD-10-CM | POA: Insufficient documentation

## 2019-02-23 MED ORDER — DENOSUMAB 60 MG/ML ~~LOC~~ SOSY
60.0000 mg | PREFILLED_SYRINGE | Freq: Once | SUBCUTANEOUS | Status: AC
  Administered 2019-02-23: 60 mg via SUBCUTANEOUS

## 2019-02-23 MED ORDER — DENOSUMAB 60 MG/ML ~~LOC~~ SOSY
PREFILLED_SYRINGE | SUBCUTANEOUS | Status: AC
  Filled 2019-02-23: qty 1

## 2019-03-26 DIAGNOSIS — E7849 Other hyperlipidemia: Secondary | ICD-10-CM | POA: Diagnosis not present

## 2019-03-26 DIAGNOSIS — M65849 Other synovitis and tenosynovitis, unspecified hand: Secondary | ICD-10-CM | POA: Diagnosis not present

## 2019-03-26 DIAGNOSIS — M810 Age-related osteoporosis without current pathological fracture: Secondary | ICD-10-CM | POA: Diagnosis not present

## 2019-03-26 DIAGNOSIS — M1990 Unspecified osteoarthritis, unspecified site: Secondary | ICD-10-CM | POA: Diagnosis not present

## 2019-04-04 IMAGING — CT CT WRIST*R* W/O CM
3 series · 12 of 33 positions shown, 14 images · non-contrast
Comparison: Plain films right wrist 02/22/2017.

CLINICAL DATA: Right wrist pain and swelling since a fall last
night. Initial encounter.

EXAM:
CT OF THE RIGHT WRIST WITHOUT CONTRAST
TECHNIQUE: Multidetector CT imaging of the right wrist was performed according
to the standard protocol. Multiplanar CT image reconstructions were
also generated.

[Series 4: upper ext 1.5 st · axial · 0.26mm/px · z∈[-949,-852]mm · 4 of 95 slices shown, 5 images]
[im 15/95  soft-tissue]
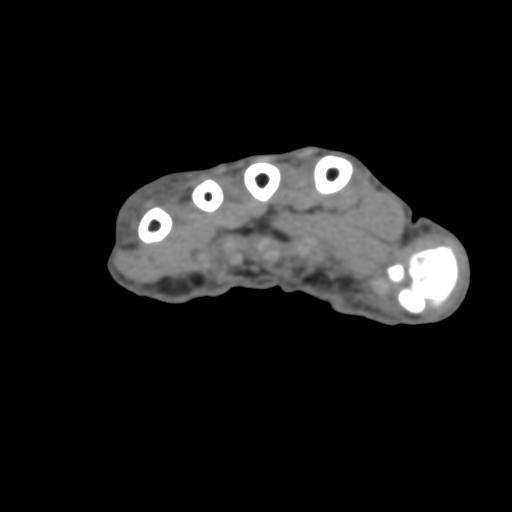
[im 15/95  bone]
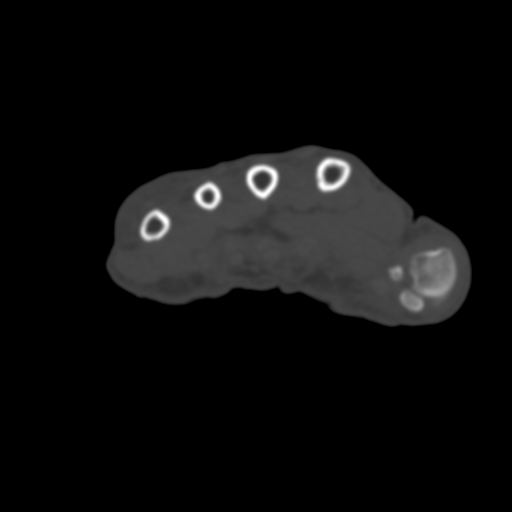
[im 37/95  bone]
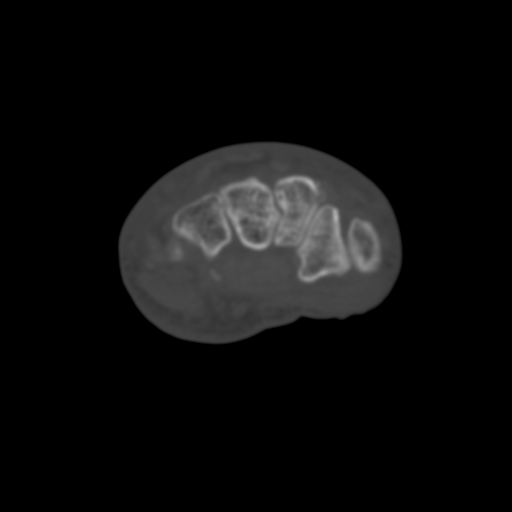
[im 58/95  bone]
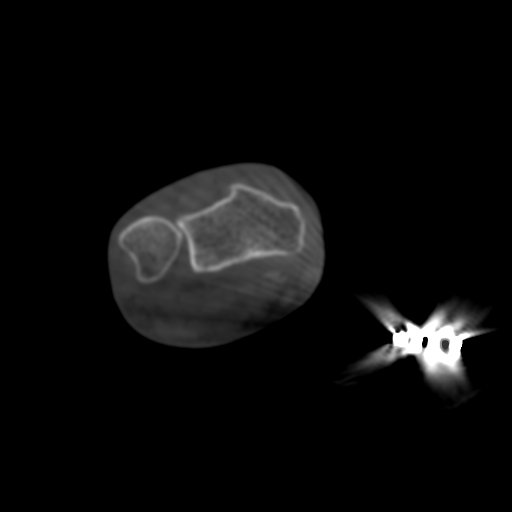
[im 80/95  bone]
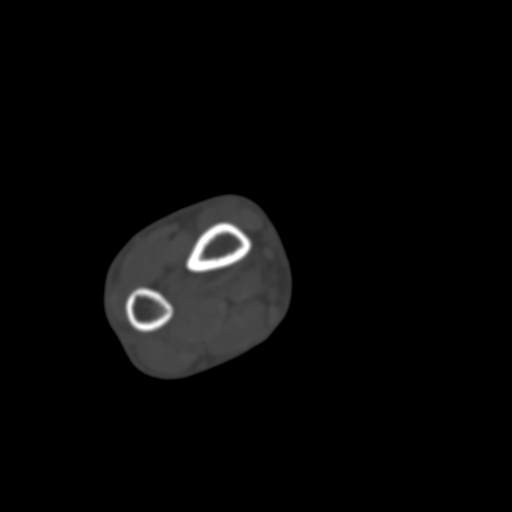

[Series 9: upper ext cor st · coronal · 0.24mm/px · 3 of 52 slices shown]
[im 11/52  bone]
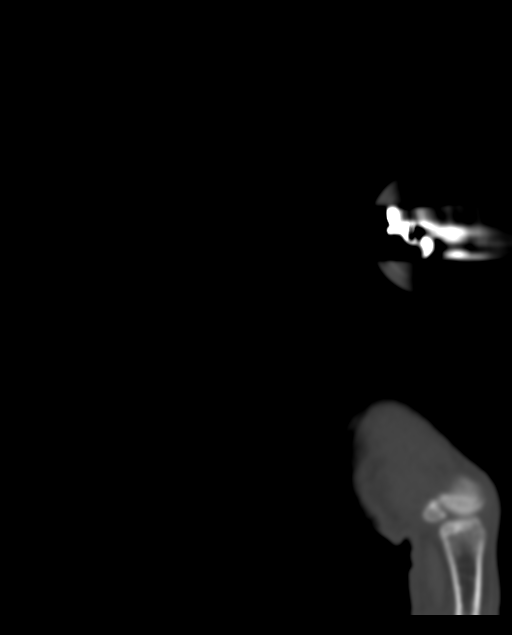
[im 21/52  bone]
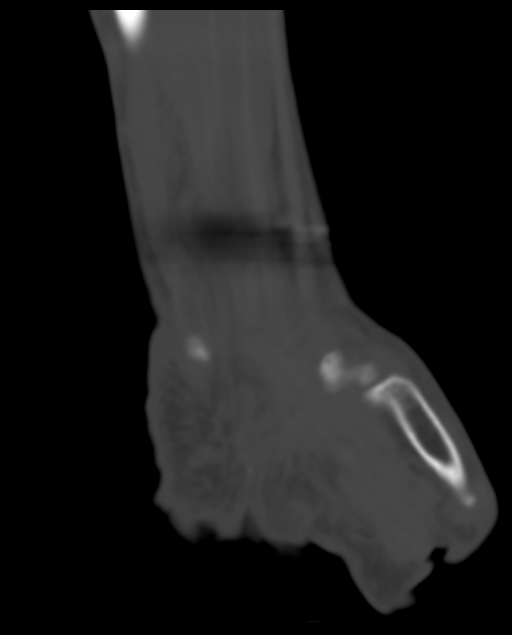
[im 31/52  bone]
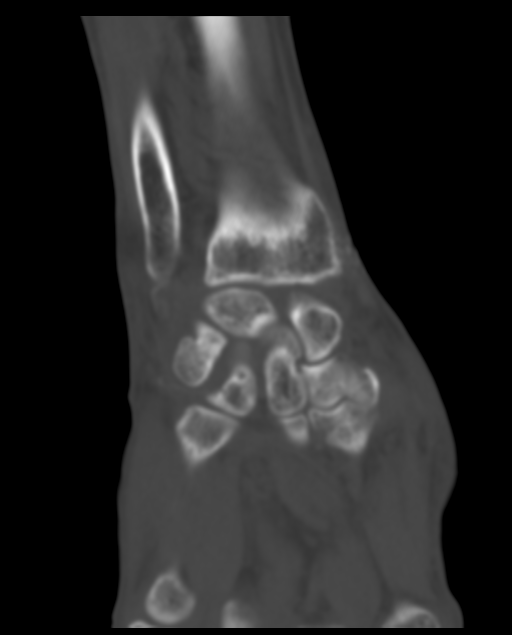

[Series 10: upper ext sag st · sagittal · 0.18mm/px · 5 of 76 slices shown, 6 images]
[im 26/76  bone]
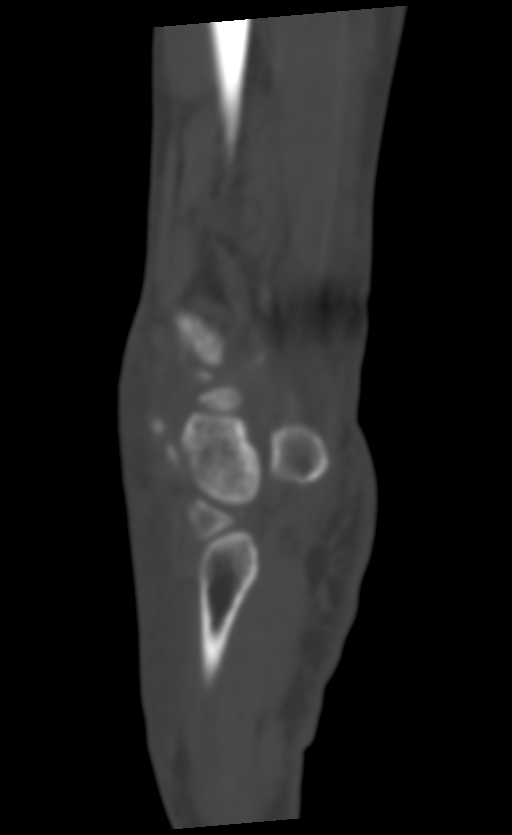
[im 32/76  bone]
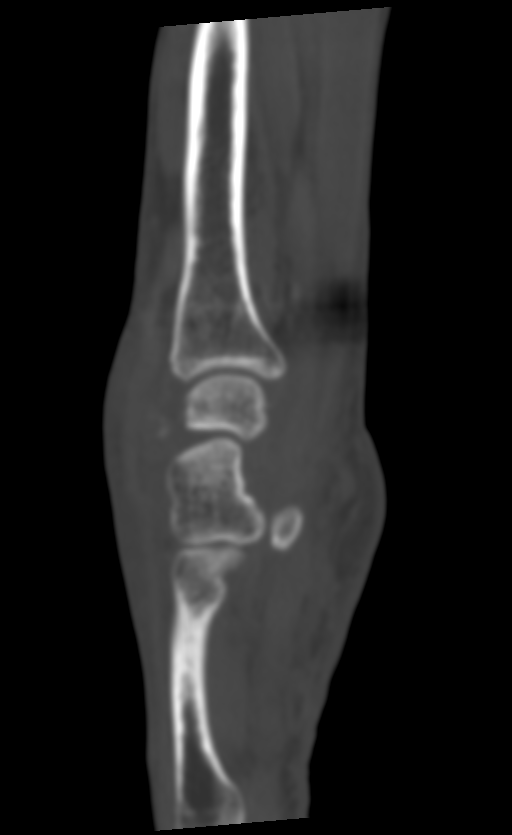
[im 38/76  soft-tissue]
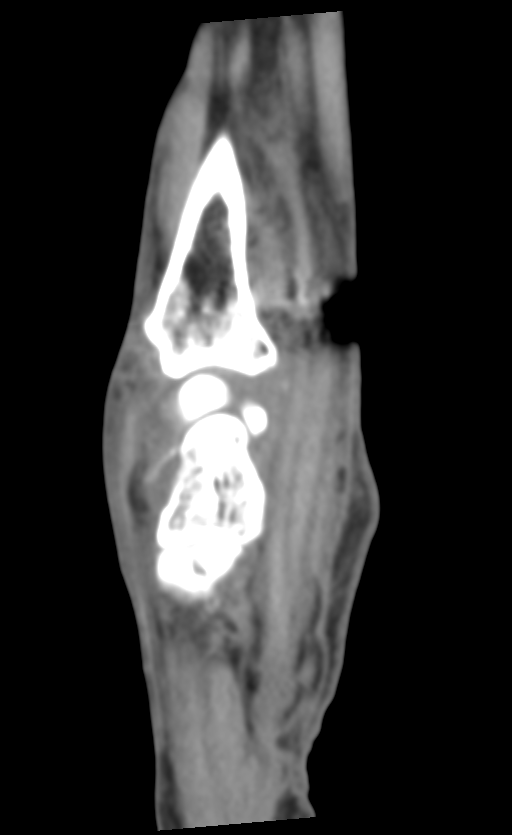
[im 38/76  bone]
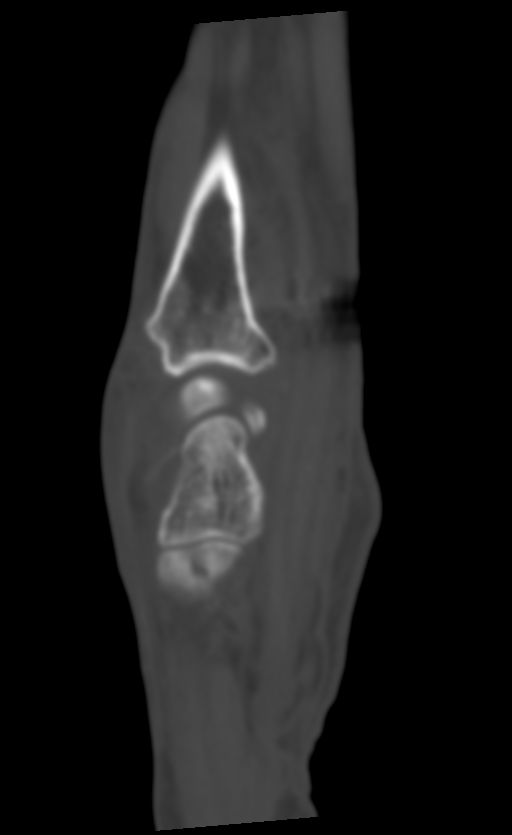
[im 44/76  bone]
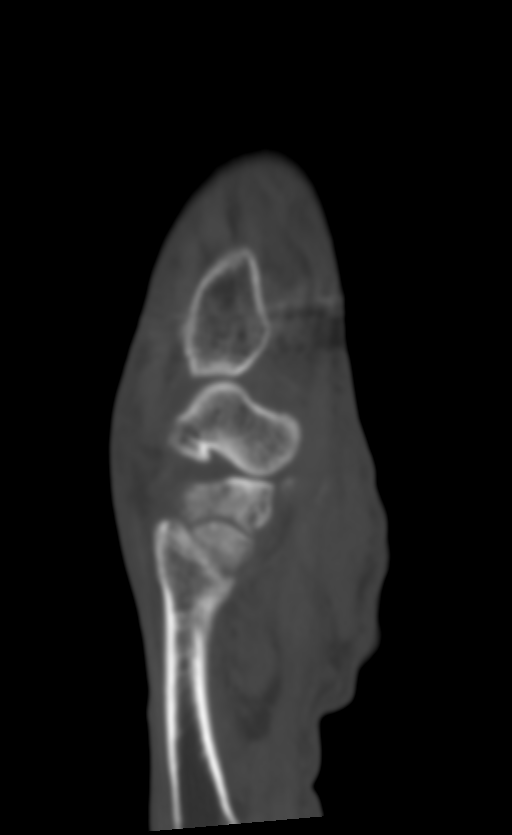
[im 51/76  bone]
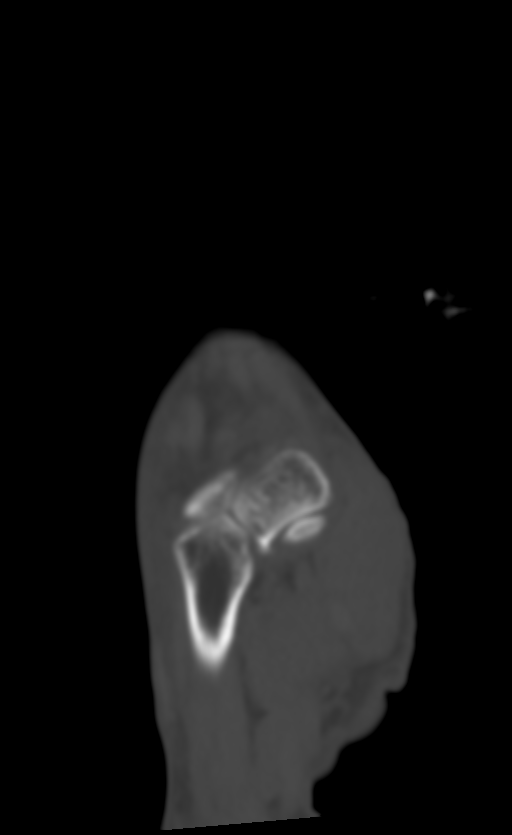

[12 of 33 positions shown; findings below may reference images not displayed]

FINDINGS: Bones/Joint/Cartilage

The patient has a chip fracture off the dorsal aspect of the
triquetrum. Fracture fragment measures 0.5 cm transverse by 0.3 cm
AP by 0.6 cm long. The fracture fragment is dorsally displaced
cm. No other acute fracture is identified. There is a well
corticated margin between the hook of the hamate and underlying
hamate likely due to congenital failure of fusion. Mild
osteophytosis and joint space narrowing first CMC joint noted.

Ligaments

Suboptimally assessed by CT.

Muscles and Tendons

Appear intact.

Soft tissues

There is soft tissue swelling about the wrist. Focus of
calcifications over the dorsal aspect of the wrist at the level of
the articulation of the lunate and triquetrum measures 0.6 cm AP by
1.2 cm transverse by 0.7 cm craniocaudal. Somewhat focal appearing
fluid is seen dorsal to capitate measuring 1 cm craniocaudal by
cm AP by approximately 1.8 cm transverse.
IMPRESSION: The examination is positive for an acute fracture off the dorsal
aspect of the triquetrum. No other acute bony abnormality is seen.

Small cluster of calcifications in the dorsum of the wrist could be
calcifications about a chronic ganglion cyst which is the favored
diagnosis. Tophus related to gout is also possible.

Findings compatible with a small ganglion cyst centered over the
dorsum of the capitate.

## 2019-06-21 ENCOUNTER — Ambulatory Visit: Payer: Medicare Other | Attending: Internal Medicine

## 2019-06-21 DIAGNOSIS — Z23 Encounter for immunization: Secondary | ICD-10-CM | POA: Insufficient documentation

## 2019-06-21 NOTE — Progress Notes (Signed)
   Covid-19 Vaccination Clinic  Name:  Ellen Blackburn    MRN: [id number] DOB: Sep 20, 1945  06/21/2019  [name] was observed post Covid-19 immunization for 15 minutes without incidence. She was provided with Vaccine Information Sheet and instruction to access the V-Safe system.   [name] was instructed to call 911 with any severe reactions post vaccine: Marland Kitchen Difficulty breathing  . Swelling of your face and throat  . A fast heartbeat  . A bad rash all over your body  . Dizziness and weakness    Immunizations Administered    Name Date Dose VIS Date Route   Pfizer COVID-19 Vaccine 06/21/2019  3:29 PM 0.3 mL 04/24/2019 Intramuscular   Manufacturer: [place]   Lot: [id number]   [place]: [id number]

## 2019-07-01 DIAGNOSIS — Z1231 Encounter for screening mammogram for malignant neoplasm of breast: Secondary | ICD-10-CM | POA: Diagnosis not present

## 2019-07-07 ENCOUNTER — Ambulatory Visit: Payer: Self-pay

## 2019-07-15 ENCOUNTER — Ambulatory Visit: Payer: Medicare Other

## 2019-07-15 ENCOUNTER — Ambulatory Visit: Payer: Medicare Other | Attending: Internal Medicine

## 2019-07-15 DIAGNOSIS — Z23 Encounter for immunization: Secondary | ICD-10-CM | POA: Insufficient documentation

## 2019-07-15 NOTE — Progress Notes (Signed)
   Covid-19 Vaccination Clinic  Name:  Ellen Blackburn    MRN: [id number] DOB: [birth year]  07/15/2019  Ms. Ellen Blackburn was observed post Covid-19 immunization for 15 minutes without incident. She was provided with Vaccine Information Sheet and instruction to access the V-Safe system.   Ms. Ellen Blackburn was instructed to call 911 with any severe reactions post vaccine: Marland Kitchen Difficulty breathing  . Swelling of face and throat  . A fast heartbeat  . A bad rash all over body  . Dizziness and weakness   Immunizations Administered    Name Date Dose VIS Date Route   Pfizer COVID-19 Vaccine 07/15/2019  8:46 AM 0.3 mL 04/24/2019 Intramuscular   Manufacturer: [place]   Lot: [id number]   [place]: [id number]

## 2019-07-20 DIAGNOSIS — M810 Age-related osteoporosis without current pathological fracture: Secondary | ICD-10-CM | POA: Diagnosis not present

## 2019-07-20 DIAGNOSIS — R7989 Other specified abnormal findings of blood chemistry: Secondary | ICD-10-CM | POA: Diagnosis not present

## 2019-07-21 DIAGNOSIS — R82998 Other abnormal findings in urine: Secondary | ICD-10-CM | POA: Diagnosis not present

## 2019-07-27 ENCOUNTER — Other Ambulatory Visit: Payer: Self-pay | Admitting: Internal Medicine

## 2019-07-27 DIAGNOSIS — N390 Urinary tract infection, site not specified: Secondary | ICD-10-CM | POA: Diagnosis not present

## 2019-07-27 DIAGNOSIS — M1990 Unspecified osteoarthritis, unspecified site: Secondary | ICD-10-CM | POA: Diagnosis not present

## 2019-07-27 DIAGNOSIS — M65849 Other synovitis and tenosynovitis, unspecified hand: Secondary | ICD-10-CM | POA: Diagnosis not present

## 2019-07-27 DIAGNOSIS — E785 Hyperlipidemia, unspecified: Secondary | ICD-10-CM | POA: Diagnosis not present

## 2019-07-27 DIAGNOSIS — Z1331 Encounter for screening for depression: Secondary | ICD-10-CM | POA: Diagnosis not present

## 2019-07-27 DIAGNOSIS — M810 Age-related osteoporosis without current pathological fracture: Secondary | ICD-10-CM | POA: Diagnosis not present

## 2019-07-27 DIAGNOSIS — Z0000 Encounter for general adult medical examination without abnormal findings: Secondary | ICD-10-CM | POA: Diagnosis not present

## 2019-07-27 DIAGNOSIS — J309 Allergic rhinitis, unspecified: Secondary | ICD-10-CM | POA: Diagnosis not present

## 2019-07-27 DIAGNOSIS — Z79899 Other long term (current) drug therapy: Secondary | ICD-10-CM | POA: Diagnosis not present

## 2019-08-07 ENCOUNTER — Encounter (HOSPITAL_COMMUNITY): Payer: Medicare Other

## 2019-08-12 DIAGNOSIS — Z1212 Encounter for screening for malignant neoplasm of rectum: Secondary | ICD-10-CM | POA: Diagnosis not present

## 2019-08-20 ENCOUNTER — Other Ambulatory Visit (HOSPITAL_COMMUNITY): Payer: Self-pay | Admitting: *Deleted

## 2019-08-20 ENCOUNTER — Other Ambulatory Visit: Payer: Medicare Other

## 2019-08-21 ENCOUNTER — Ambulatory Visit (HOSPITAL_COMMUNITY)
Admission: RE | Admit: 2019-08-21 | Discharge: 2019-08-21 | Disposition: A | Discharge status: Home / Self Care | Payer: Medicare Other | Source: Ambulatory Visit | Attending: Internal Medicine | Admitting: Internal Medicine

## 2019-08-21 ENCOUNTER — Other Ambulatory Visit: Payer: Self-pay

## 2019-08-21 DIAGNOSIS — M810 Age-related osteoporosis without current pathological fracture: Secondary | ICD-10-CM | POA: Diagnosis not present

## 2019-08-21 MED ORDER — DENOSUMAB 60 MG/ML ~~LOC~~ SOSY
PREFILLED_SYRINGE | SUBCUTANEOUS | Status: AC
  Filled 2019-08-21: qty 1

## 2019-08-21 MED ORDER — DENOSUMAB 60 MG/ML ~~LOC~~ SOSY
60.0000 mg | PREFILLED_SYRINGE | Freq: Once | SUBCUTANEOUS | Status: AC
  Administered 2019-08-21: 60 mg via SUBCUTANEOUS

## 2019-09-08 ENCOUNTER — Other Ambulatory Visit: Payer: Medicare Other

## 2019-09-22 ENCOUNTER — Ambulatory Visit
Admission: RE | Admit: 2019-09-22 | Discharge: 2019-09-22 | Disposition: A | Discharge status: Home / Self Care | Payer: Medicare Other | Source: Ambulatory Visit | Attending: Internal Medicine | Admitting: Internal Medicine

## 2019-09-22 DIAGNOSIS — E785 Hyperlipidemia, unspecified: Secondary | ICD-10-CM

## 2019-09-23 DIAGNOSIS — H2513 Age-related nuclear cataract, bilateral: Secondary | ICD-10-CM | POA: Diagnosis not present

## 2019-09-23 DIAGNOSIS — H5203 Hypermetropia, bilateral: Secondary | ICD-10-CM | POA: Diagnosis not present

## 2019-09-23 DIAGNOSIS — H524 Presbyopia: Secondary | ICD-10-CM | POA: Diagnosis not present

## 2020-01-26 ENCOUNTER — Encounter: Payer: Self-pay | Admitting: Dermatology

## 2020-01-26 ENCOUNTER — Ambulatory Visit (INDEPENDENT_AMBULATORY_CARE_PROVIDER_SITE_OTHER): Payer: Medicare Other | Admitting: Dermatology

## 2020-01-26 ENCOUNTER — Other Ambulatory Visit: Payer: Self-pay

## 2020-01-26 DIAGNOSIS — L578 Other skin changes due to chronic exposure to nonionizing radiation: Secondary | ICD-10-CM

## 2020-01-26 DIAGNOSIS — C4492 Squamous cell carcinoma of skin, unspecified: Secondary | ICD-10-CM

## 2020-01-26 DIAGNOSIS — D492 Neoplasm of unspecified behavior of bone, soft tissue, and skin: Secondary | ICD-10-CM

## 2020-01-26 DIAGNOSIS — C44622 Squamous cell carcinoma of skin of right upper limb, including shoulder: Secondary | ICD-10-CM | POA: Diagnosis not present

## 2020-01-26 HISTORY — DX: Squamous cell carcinoma of skin, unspecified: C44.92

## 2020-01-26 NOTE — Patient Instructions (Signed)

## 2020-01-26 NOTE — Progress Notes (Signed)
   Follow-Up Visit   Subjective  Ellen Blackburn is a 74 y.o. female who presents for the following: Skin Problem (Check a growth on the R shoulder x 2 weeks ). Hx of AKs and SCC.  The following portions of the chart were reviewed this encounter and updated as appropriate:  Tobacco  Allergies  Meds  Problems  Med Hx  Surg Hx  Fam Hx     Review of Systems:  No other skin or systemic complaints except as noted in HPI or Assessment and Plan.  Objective  Well appearing patient in no apparent distress; mood and affect are within normal limits.  A focused examination was performed including Right shoulder . Relevant physical exam findings are noted in the Assessment and Plan.  Objective  Right top of shoulder: 0.8 cm keratotic papule    Assessment & Plan  Neoplasm of skin Right top of shoulder  Epidermal / dermal shaving  Lesion diameter (cm):  0.8 Informed consent: discussed and consent obtained   Timeout: patient name, date of birth, surgical site, and procedure verified   Procedure prep:  Patient was prepped and draped in usual sterile fashion Prep type:  Isopropyl alcohol Anesthesia: the lesion was anesthetized in a standard fashion   Anesthetic:  1% lidocaine w/ epinephrine 1-100,000 buffered w/ 8.4% NaHCO3 Hemostasis achieved with: pressure, aluminum chloride and electrodesiccation   Outcome: patient tolerated procedure well   Post-procedure details: sterile dressing applied and wound care instructions given   Dressing type: bandage and petrolatum    Destruction of lesion Complexity: extensive   Destruction method: electrodesiccation and curettage   Informed consent: discussed and consent obtained   Timeout:  patient name, date of birth, surgical site, and procedure verified Procedure prep:  Patient was prepped and draped in usual sterile fashion Prep type:  Isopropyl alcohol Anesthesia: the lesion was anesthetized in a standard fashion   Anesthetic:   1% lidocaine w/ epinephrine 1-100,000 buffered w/ 8.4% NaHCO3 Curettage performed in three different directions: Yes   Electrodesiccation performed over the curetted area: Yes   Lesion length (cm):  0.8 Lesion width (cm):  0.8 Margin per side (cm):  0.2 Final wound size (cm):  1.2 Hemostasis achieved with:  pressure, aluminum chloride and electrodesiccation Outcome: patient tolerated procedure well with no complications   Post-procedure details: sterile dressing applied and wound care instructions given   Dressing type: bandage and petrolatum    Specimen 1 - Surgical pathology Differential Diagnosis: R/O SCC Check Margins: No 0.8 cm keratotic papule  Shave removal and EDC   SCC (squamous cell carcinoma), shoulder, right  Actinic Damage - diffuse scaly erythematous macules with underlying dyspigmentation - Recommend daily broad spectrum sunscreen SPF 30+ to sun-exposed areas, reapply every 2 hours as needed.  - Call for new or changing lesions.  Return in about 2 months (around 03/27/2020) for TBSE.  IMarye Round, CMA, am acting as scribe for Sarina Ser, MD . Documentation: I have reviewed the above documentation for accuracy and completeness, and I agree with the above.  Sarina Ser, MD

## 2020-01-27 DIAGNOSIS — Z03818 Encounter for observation for suspected exposure to other biological agents ruled out: Secondary | ICD-10-CM | POA: Diagnosis not present

## 2020-01-27 DIAGNOSIS — Z20822 Contact with and (suspected) exposure to covid-19: Secondary | ICD-10-CM | POA: Diagnosis not present

## 2020-02-01 ENCOUNTER — Telehealth: Payer: Self-pay

## 2020-02-01 NOTE — Telephone Encounter (Signed)
LM on VM please return my call  

## 2020-02-01 NOTE — Telephone Encounter (Signed)
-----   Message from Ralene Bathe, MD sent at 02/01/2020 12:55 PM EDT ----- Skin , right top of shoulder WELL DIFFERENTIATED SQUAMOUS CELL CARCINOMA  Cancer - SCC Already treated Recheck next visit

## 2020-02-02 ENCOUNTER — Telehealth: Payer: Self-pay

## 2020-02-02 NOTE — Telephone Encounter (Signed)
Left pt msg to call for bx results/sh 

## 2020-02-02 NOTE — Telephone Encounter (Signed)
-----   Message from Ralene Bathe, MD sent at 02/01/2020 12:55 PM EDT ----- Skin , right top of shoulder WELL DIFFERENTIATED SQUAMOUS CELL CARCINOMA  Cancer - SCC Already treated Recheck next visit

## 2020-02-06 DIAGNOSIS — Z23 Encounter for immunization: Secondary | ICD-10-CM | POA: Diagnosis not present

## 2020-02-08 DIAGNOSIS — Z23 Encounter for immunization: Secondary | ICD-10-CM | POA: Diagnosis not present

## 2020-02-11 ENCOUNTER — Telehealth: Payer: Self-pay

## 2020-02-11 NOTE — Telephone Encounter (Signed)
Patient advised of BX results.  °

## 2020-02-11 NOTE — Telephone Encounter (Signed)
-----   Message from Ralene Bathe, MD sent at 02/01/2020 12:55 PM EDT ----- Skin , right top of shoulder WELL DIFFERENTIATED SQUAMOUS CELL CARCINOMA  Cancer - SCC Already treated Recheck next visit

## 2020-02-11 NOTE — Telephone Encounter (Signed)
Left message on voicemail to return my call.  

## 2020-04-04 ENCOUNTER — Ambulatory Visit (INDEPENDENT_AMBULATORY_CARE_PROVIDER_SITE_OTHER): Payer: Medicare Other | Admitting: Dermatology

## 2020-04-04 ENCOUNTER — Other Ambulatory Visit: Payer: Self-pay

## 2020-04-04 DIAGNOSIS — D1800 Hemangioma unspecified site: Secondary | ICD-10-CM | POA: Diagnosis not present

## 2020-04-04 DIAGNOSIS — L578 Other skin changes due to chronic exposure to nonionizing radiation: Secondary | ICD-10-CM

## 2020-04-04 DIAGNOSIS — D229 Melanocytic nevi, unspecified: Secondary | ICD-10-CM | POA: Diagnosis not present

## 2020-04-04 DIAGNOSIS — L814 Other melanin hyperpigmentation: Secondary | ICD-10-CM

## 2020-04-04 DIAGNOSIS — Z872 Personal history of diseases of the skin and subcutaneous tissue: Secondary | ICD-10-CM | POA: Diagnosis not present

## 2020-04-04 DIAGNOSIS — L820 Inflamed seborrheic keratosis: Secondary | ICD-10-CM

## 2020-04-04 DIAGNOSIS — Z1283 Encounter for screening for malignant neoplasm of skin: Secondary | ICD-10-CM

## 2020-04-04 DIAGNOSIS — Z85828 Personal history of other malignant neoplasm of skin: Secondary | ICD-10-CM

## 2020-04-04 DIAGNOSIS — L570 Actinic keratosis: Secondary | ICD-10-CM

## 2020-04-04 DIAGNOSIS — L821 Other seborrheic keratosis: Secondary | ICD-10-CM

## 2020-04-04 NOTE — Patient Instructions (Signed)

## 2020-04-04 NOTE — Progress Notes (Signed)
Follow-Up Visit   Subjective  Ellen Blackburn is a 74 y.o. female who presents for the following: FBSE. Patient here for full body skin exam and skin cancer screening. Patient has a history of AK's and SCC. Nothing new or changing today that patient is aware of.   The following portions of the chart were reviewed this encounter and updated as appropriate:  Tobacco  Allergies  Meds  Problems  Med Hx  Surg Hx  Fam Hx     Review of Systems:  No other skin or systemic complaints except as noted in HPI or Assessment and Plan.  Objective  Well appearing patient in no apparent distress; mood and affect are within normal limits.  A full examination was performed including scalp, head, eyes, ears, nose, lips, neck, chest, axillae, abdomen, back, buttocks, bilateral upper extremities, bilateral lower extremities, hands, feet, fingers, toes, fingernails, and toenails. All findings within normal limits unless otherwise noted below.  Objective  L medial cheek: Erythematous thin papules/macules with gritty scale.    Assessment & Plan  Inflamed seborrheic keratosis R deltoid x 1  Destruction of lesion - R deltoid x 1 Complexity: simple   Destruction method: cryotherapy   Informed consent: discussed and consent obtained   Timeout:  patient name, date of birth, surgical site, and procedure verified Lesion destroyed using liquid nitrogen: Yes   Region frozen until ice ball extended beyond lesion: Yes   Outcome: patient tolerated procedure well with no complications   Post-procedure details: wound care instructions given    AK (actinic keratosis) L medial cheek  Destruction of lesion - L medial cheek Complexity: simple   Destruction method: cryotherapy   Informed consent: discussed and consent obtained   Timeout:  patient name, date of birth, surgical site, and procedure verified Lesion destroyed using liquid nitrogen: Yes   Region frozen until ice ball extended beyond  lesion: Yes   Outcome: patient tolerated procedure well with no complications   Post-procedure details: wound care instructions given     Lentigines - Scattered tan macules - Discussed due to sun exposure - Benign, observe - Call for any changes  Seborrheic Keratoses - Stuck-on, waxy, tan-brown papules and plaques  - Discussed benign etiology and prognosis. - Observe - Call for any changes  Melanocytic Nevi - Tan-brown and/or pink-flesh-colored symmetric macules and papules - Benign appearing on exam today - Observation - Call clinic for new or changing moles - Recommend daily use of broad spectrum spf 30+ sunscreen to sun-exposed areas.   Hemangiomas - Red papules - Discussed benign nature - Observe - Call for any changes  Actinic Damage - Chronic, secondary to cumulative UV/sun exposure - diffuse scaly erythematous macules with underlying dyspigmentation - Recommend daily broad spectrum sunscreen SPF 30+ to sun-exposed areas, reapply every 2 hours as needed.  - Call for new or changing lesions.  Skin cancer screening performed today.  History of Squamous Cell Carcinoma of the Skin - No evidence of recurrence today at right top of shoulder, scalp - No lymphadenopathy - Recommend regular full body skin exams - Recommend daily broad spectrum sunscreen SPF 30+ to sun-exposed areas, reapply every 2 hours as needed.  - Call if any new or changing lesions are noted between office visits  Return in about 1 year (around 04/04/2021) for TBSE.  Graciella Belton, RMA, am acting as scribe for Sarina Ser, MD . Documentation: I have reviewed the above documentation for accuracy and completeness, and I agree with the above.  Sarina Ser, MD

## 2020-04-12 ENCOUNTER — Encounter: Payer: Self-pay | Admitting: Dermatology

## 2020-07-06 DIAGNOSIS — Z1231 Encounter for screening mammogram for malignant neoplasm of breast: Secondary | ICD-10-CM | POA: Diagnosis not present

## 2020-08-12 DIAGNOSIS — Z23 Encounter for immunization: Secondary | ICD-10-CM | POA: Diagnosis not present

## 2020-08-25 DIAGNOSIS — E785 Hyperlipidemia, unspecified: Secondary | ICD-10-CM | POA: Diagnosis not present

## 2020-08-25 DIAGNOSIS — M810 Age-related osteoporosis without current pathological fracture: Secondary | ICD-10-CM | POA: Diagnosis not present

## 2020-08-31 DIAGNOSIS — R82998 Other abnormal findings in urine: Secondary | ICD-10-CM | POA: Diagnosis not present

## 2020-09-01 DIAGNOSIS — M1990 Unspecified osteoarthritis, unspecified site: Secondary | ICD-10-CM | POA: Diagnosis not present

## 2020-09-01 DIAGNOSIS — Z0000 Encounter for general adult medical examination without abnormal findings: Secondary | ICD-10-CM | POA: Diagnosis not present

## 2020-09-01 DIAGNOSIS — Z1212 Encounter for screening for malignant neoplasm of rectum: Secondary | ICD-10-CM | POA: Diagnosis not present

## 2020-09-01 DIAGNOSIS — R413 Other amnesia: Secondary | ICD-10-CM | POA: Diagnosis not present

## 2020-09-01 DIAGNOSIS — E785 Hyperlipidemia, unspecified: Secondary | ICD-10-CM | POA: Diagnosis not present

## 2020-09-01 DIAGNOSIS — M810 Age-related osteoporosis without current pathological fracture: Secondary | ICD-10-CM | POA: Diagnosis not present

## 2020-09-01 DIAGNOSIS — J309 Allergic rhinitis, unspecified: Secondary | ICD-10-CM | POA: Diagnosis not present

## 2020-09-23 DIAGNOSIS — H5203 Hypermetropia, bilateral: Secondary | ICD-10-CM | POA: Diagnosis not present

## 2020-09-23 DIAGNOSIS — H2513 Age-related nuclear cataract, bilateral: Secondary | ICD-10-CM | POA: Diagnosis not present

## 2021-01-05 ENCOUNTER — Encounter: Payer: Self-pay | Admitting: Dermatology

## 2021-01-05 ENCOUNTER — Ambulatory Visit (INDEPENDENT_AMBULATORY_CARE_PROVIDER_SITE_OTHER): Payer: Medicare Other | Admitting: Dermatology

## 2021-01-05 ENCOUNTER — Other Ambulatory Visit: Payer: Self-pay

## 2021-01-05 DIAGNOSIS — L578 Other skin changes due to chronic exposure to nonionizing radiation: Secondary | ICD-10-CM

## 2021-01-05 DIAGNOSIS — B070 Plantar wart: Secondary | ICD-10-CM | POA: Diagnosis not present

## 2021-01-05 DIAGNOSIS — L820 Inflamed seborrheic keratosis: Secondary | ICD-10-CM | POA: Diagnosis not present

## 2021-01-05 NOTE — Patient Instructions (Signed)

## 2021-01-05 NOTE — Progress Notes (Signed)
   Follow-Up Visit   Subjective  Ellen Blackburn is a 75 y.o. female who presents for the following: Other (Spot of face and right great toe that are irritating/).  The following portions of the chart were reviewed this encounter and updated as appropriate:   Tobacco  Allergies  Meds  Problems  Med Hx  Surg Hx  Fam Hx     Review of Systems:  No other skin or systemic complaints except as noted in HPI or Assessment and Plan.  Objective  Well appearing patient in no apparent distress; mood and affect are within normal limits.  A focused examination was performed including face, right foot. Relevant physical exam findings are noted in the Assessment and Plan.  Left cheek x 1 Erythematous keratotic or waxy stuck-on papule or plaque.   Right great toe 0.4 cm flesh colored papule   Assessment & Plan  Inflamed seborrheic keratosis Left cheek x 1  Destruction of lesion - Left cheek x 1 Complexity: simple   Destruction method: cryotherapy   Informed consent: discussed and consent obtained   Timeout:  patient name, date of birth, surgical site, and procedure verified Lesion destroyed using liquid nitrogen: Yes   Region frozen until ice ball extended beyond lesion: Yes   Outcome: patient tolerated procedure well with no complications   Post-procedure details: wound care instructions given    Plantar wart Right great toe  RTC if persistent after 6 weeks  Destruction of lesion - Right great toe Complexity: simple   Destruction method: cryotherapy   Informed consent: discussed and consent obtained   Timeout:  patient name, date of birth, surgical site, and procedure verified Lesion destroyed using liquid nitrogen: Yes   Region frozen until ice ball extended beyond lesion: Yes   Outcome: patient tolerated procedure well with no complications   Post-procedure details: wound care instructions given    Actinic Damage - chronic, secondary to cumulative UV radiation  exposure/sun exposure over time - diffuse scaly erythematous macules with underlying dyspigmentation - Recommend daily broad spectrum sunscreen SPF 30+ to sun-exposed areas, reapply every 2 hours as needed.  - Recommend staying in the shade or wearing long sleeves, sun glasses (UVA+UVB protection) and wide brim hats (4-inch brim around the entire circumference of the hat). - Call for new or changing lesions.  Return for Follow up as scheduled, TBSE.  I, Ashok Cordia, CMA, am acting as scribe for Sarina Ser, MD . Documentation: I have reviewed the above documentation for accuracy and completeness, and I agree with the above.  Sarina Ser, MD

## 2021-01-20 DIAGNOSIS — Z23 Encounter for immunization: Secondary | ICD-10-CM | POA: Diagnosis not present

## 2021-02-08 DIAGNOSIS — Z23 Encounter for immunization: Secondary | ICD-10-CM | POA: Diagnosis not present

## 2021-04-17 ENCOUNTER — Other Ambulatory Visit: Payer: Self-pay

## 2021-04-17 ENCOUNTER — Ambulatory Visit (INDEPENDENT_AMBULATORY_CARE_PROVIDER_SITE_OTHER): Payer: Medicare Other | Admitting: Dermatology

## 2021-04-17 DIAGNOSIS — L820 Inflamed seborrheic keratosis: Secondary | ICD-10-CM

## 2021-04-17 DIAGNOSIS — L578 Other skin changes due to chronic exposure to nonionizing radiation: Secondary | ICD-10-CM

## 2021-04-17 DIAGNOSIS — L814 Other melanin hyperpigmentation: Secondary | ICD-10-CM

## 2021-04-17 DIAGNOSIS — L570 Actinic keratosis: Secondary | ICD-10-CM | POA: Diagnosis not present

## 2021-04-17 DIAGNOSIS — D229 Melanocytic nevi, unspecified: Secondary | ICD-10-CM | POA: Diagnosis not present

## 2021-04-17 DIAGNOSIS — D1800 Hemangioma unspecified site: Secondary | ICD-10-CM | POA: Diagnosis not present

## 2021-04-17 DIAGNOSIS — L821 Other seborrheic keratosis: Secondary | ICD-10-CM | POA: Diagnosis not present

## 2021-04-17 DIAGNOSIS — Z85828 Personal history of other malignant neoplasm of skin: Secondary | ICD-10-CM

## 2021-04-17 DIAGNOSIS — Z1283 Encounter for screening for malignant neoplasm of skin: Secondary | ICD-10-CM

## 2021-04-17 DIAGNOSIS — S90221A Contusion of right lesser toe(s) with damage to nail, initial encounter: Secondary | ICD-10-CM | POA: Diagnosis not present

## 2021-04-17 NOTE — Progress Notes (Signed)
Follow-Up Visit   Subjective  Ellen Blackburn is a 75 y.o. female who presents for the following: Annual Exam (History of SCC - TBSE today).  The patient presents for Total-Body Skin Exam (TBSE) for skin cancer screening and mole check.  The patient has spots, moles and lesions to be evaluated, some may be new or changing and the patient has concerns that these could be cancer.  The following portions of the chart were reviewed this encounter and updated as appropriate:   Tobacco  Allergies  Meds  Problems  Med Hx  Surg Hx  Fam Hx     Review of Systems:  No other skin or systemic complaints except as noted in HPI or Assessment and Plan.  Objective  Well appearing patient in no apparent distress; mood and affect are within normal limits.  A full examination was performed including scalp, head, eyes, ears, nose, lips, neck, chest, axillae, abdomen, back, buttocks, bilateral upper extremities, bilateral lower extremities, hands, feet, fingers, toes, fingernails, and toenails. All findings within normal limits unless otherwise noted below.  Face x 6, scalp x 5 (11) Erythematous thin papules/macules with gritty scale.   Forehead x 4 (4) Erythematous keratotic or waxy stuck-on papule or plaque.    Assessment & Plan   History of Squamous Cell Carcinoma of the Skin - No evidence of recurrence today - No lymphadenopathy - Recommend regular full body skin exams - Recommend daily broad spectrum sunscreen SPF 30+ to sun-exposed areas, reapply every 2 hours as needed.  - Call if any new or changing lesions are noted between office visits  Lentigines - Scattered tan macules - Due to sun exposure - Benign-appearing, observe - Recommend daily broad spectrum sunscreen SPF 30+ to sun-exposed areas, reapply every 2 hours as needed. - Call for any changes  Seborrheic Keratoses - Stuck-on, waxy, tan-brown papules and/or plaques  - Benign-appearing - Discussed benign etiology  and prognosis. - Observe - Call for any changes  Melanocytic Nevi - Tan-brown and/or pink-flesh-colored symmetric macules and papules - Benign appearing on exam today - Observation - Call clinic for new or changing moles - Recommend daily use of broad spectrum spf 30+ sunscreen to sun-exposed areas.   Hemangiomas - Red papules - Discussed benign nature - Observe - Call for any changes  Actinic Damage - Chronic condition, secondary to cumulative UV/sun exposure - diffuse scaly erythematous macules with underlying dyspigmentation - Recommend daily broad spectrum sunscreen SPF 30+ to sun-exposed areas, reapply every 2 hours as needed.  - Staying in the shade or wearing long sleeves, sun glasses (UVA+UVB protection) and wide brim hats (4-inch brim around the entire circumference of the hat) are also recommended for sun protection.  - Call for new or changing lesions.  Skin cancer screening performed today.  AK (actinic keratosis) (11) Face x 6, scalp x 5 Destruction of lesion - Face x 6, scalp x 5 Complexity: simple   Destruction method: cryotherapy   Informed consent: discussed and consent obtained   Timeout:  patient name, date of birth, surgical site, and procedure verified Lesion destroyed using liquid nitrogen: Yes   Region frozen until ice ball extended beyond lesion: Yes   Outcome: patient tolerated procedure well with no complications   Post-procedure details: wound care instructions given    Subungual hematoma of fourth toe of right foot, initial encounter Right 4th Toe Nail Plate Benign-appearing.  Observation.  Call clinic for new or changing lesions.  Recommend daily use of broad spectrum  spf 30+ sunscreen to sun-exposed areas.   Inflamed seborrheic keratosis Forehead x 4 Destruction of lesion - Forehead x 4 Complexity: simple   Destruction method: cryotherapy   Informed consent: discussed and consent obtained   Timeout:  patient name, date of birth, surgical  site, and procedure verified Lesion destroyed using liquid nitrogen: Yes   Region frozen until ice ball extended beyond lesion: Yes   Outcome: patient tolerated procedure well with no complications   Post-procedure details: wound care instructions given    Skin cancer screening  Return in about 3 months (around 07/16/2021) for AK follow up.  I, Ashok Cordia, CMA, am acting as scribe for Sarina Ser, MD . Documentation: I have reviewed the above documentation for accuracy and completeness, and I agree with the above.  Sarina Ser, MD

## 2021-04-17 NOTE — Patient Instructions (Signed)

## 2021-04-26 ENCOUNTER — Encounter: Payer: Self-pay | Admitting: Dermatology

## 2021-07-11 DIAGNOSIS — Z1231 Encounter for screening mammogram for malignant neoplasm of breast: Secondary | ICD-10-CM | POA: Diagnosis not present

## 2021-08-02 ENCOUNTER — Ambulatory Visit: Payer: Medicare Other | Admitting: Dermatology

## 2021-08-08 DIAGNOSIS — M67952 Unspecified disorder of synovium and tendon, left thigh: Secondary | ICD-10-CM | POA: Diagnosis not present

## 2021-08-16 DIAGNOSIS — Z1152 Encounter for screening for COVID-19: Secondary | ICD-10-CM | POA: Diagnosis not present

## 2021-08-16 DIAGNOSIS — J309 Allergic rhinitis, unspecified: Secondary | ICD-10-CM | POA: Diagnosis not present

## 2021-08-16 DIAGNOSIS — R051 Acute cough: Secondary | ICD-10-CM | POA: Diagnosis not present

## 2021-08-16 DIAGNOSIS — R5383 Other fatigue: Secondary | ICD-10-CM | POA: Diagnosis not present

## 2021-08-16 DIAGNOSIS — R509 Fever, unspecified: Secondary | ICD-10-CM | POA: Diagnosis not present

## 2021-09-05 DIAGNOSIS — Z20822 Contact with and (suspected) exposure to covid-19: Secondary | ICD-10-CM | POA: Diagnosis not present

## 2021-09-16 DIAGNOSIS — Z20822 Contact with and (suspected) exposure to covid-19: Secondary | ICD-10-CM | POA: Diagnosis not present

## 2021-09-18 DIAGNOSIS — E785 Hyperlipidemia, unspecified: Secondary | ICD-10-CM | POA: Diagnosis not present

## 2021-09-18 DIAGNOSIS — M810 Age-related osteoporosis without current pathological fracture: Secondary | ICD-10-CM | POA: Diagnosis not present

## 2021-09-18 DIAGNOSIS — R7989 Other specified abnormal findings of blood chemistry: Secondary | ICD-10-CM | POA: Diagnosis not present

## 2021-09-18 DIAGNOSIS — Z0000 Encounter for general adult medical examination without abnormal findings: Secondary | ICD-10-CM | POA: Diagnosis not present

## 2021-09-25 DIAGNOSIS — M810 Age-related osteoporosis without current pathological fracture: Secondary | ICD-10-CM | POA: Diagnosis not present

## 2021-09-25 DIAGNOSIS — J309 Allergic rhinitis, unspecified: Secondary | ICD-10-CM | POA: Diagnosis not present

## 2021-09-25 DIAGNOSIS — Z1389 Encounter for screening for other disorder: Secondary | ICD-10-CM | POA: Diagnosis not present

## 2021-09-25 DIAGNOSIS — M1990 Unspecified osteoarthritis, unspecified site: Secondary | ICD-10-CM | POA: Diagnosis not present

## 2021-09-25 DIAGNOSIS — R413 Other amnesia: Secondary | ICD-10-CM | POA: Diagnosis not present

## 2021-09-25 DIAGNOSIS — Z0000 Encounter for general adult medical examination without abnormal findings: Secondary | ICD-10-CM | POA: Diagnosis not present

## 2021-09-25 DIAGNOSIS — Z1331 Encounter for screening for depression: Secondary | ICD-10-CM | POA: Diagnosis not present

## 2021-09-25 DIAGNOSIS — E785 Hyperlipidemia, unspecified: Secondary | ICD-10-CM | POA: Diagnosis not present

## 2021-10-02 DIAGNOSIS — H25013 Cortical age-related cataract, bilateral: Secondary | ICD-10-CM | POA: Diagnosis not present

## 2021-10-02 DIAGNOSIS — H52203 Unspecified astigmatism, bilateral: Secondary | ICD-10-CM | POA: Diagnosis not present

## 2021-10-02 DIAGNOSIS — H26103 Unspecified traumatic cataract, bilateral: Secondary | ICD-10-CM | POA: Diagnosis not present

## 2021-10-02 DIAGNOSIS — H5203 Hypermetropia, bilateral: Secondary | ICD-10-CM | POA: Diagnosis not present

## 2021-11-21 DIAGNOSIS — M810 Age-related osteoporosis without current pathological fracture: Secondary | ICD-10-CM | POA: Diagnosis not present

## 2021-12-20 ENCOUNTER — Ambulatory Visit: Payer: Medicare Other | Admitting: Dermatology

## 2021-12-29 DIAGNOSIS — M1990 Unspecified osteoarthritis, unspecified site: Secondary | ICD-10-CM | POA: Diagnosis not present

## 2021-12-29 DIAGNOSIS — E785 Hyperlipidemia, unspecified: Secondary | ICD-10-CM | POA: Diagnosis not present

## 2021-12-29 DIAGNOSIS — M810 Age-related osteoporosis without current pathological fracture: Secondary | ICD-10-CM | POA: Diagnosis not present

## 2021-12-29 DIAGNOSIS — J309 Allergic rhinitis, unspecified: Secondary | ICD-10-CM | POA: Diagnosis not present

## 2021-12-29 DIAGNOSIS — R413 Other amnesia: Secondary | ICD-10-CM | POA: Diagnosis not present

## 2022-01-01 ENCOUNTER — Other Ambulatory Visit: Payer: Self-pay | Admitting: Internal Medicine

## 2022-01-01 DIAGNOSIS — R413 Other amnesia: Secondary | ICD-10-CM

## 2022-01-08 ENCOUNTER — Ambulatory Visit: Payer: Medicare Other | Admitting: Dermatology

## 2022-01-27 DIAGNOSIS — Z23 Encounter for immunization: Secondary | ICD-10-CM | POA: Diagnosis not present

## 2022-02-07 ENCOUNTER — Ambulatory Visit
Admission: RE | Admit: 2022-02-07 | Discharge: 2022-02-07 | Disposition: A | Discharge status: Home / Self Care | Payer: Medicare Other | Source: Ambulatory Visit | Attending: Internal Medicine | Admitting: Internal Medicine

## 2022-02-07 DIAGNOSIS — R413 Other amnesia: Secondary | ICD-10-CM

## 2022-02-07 DIAGNOSIS — G319 Degenerative disease of nervous system, unspecified: Secondary | ICD-10-CM | POA: Diagnosis not present

## 2022-02-08 DIAGNOSIS — Z23 Encounter for immunization: Secondary | ICD-10-CM | POA: Diagnosis not present

## 2022-02-28 DIAGNOSIS — R413 Other amnesia: Secondary | ICD-10-CM | POA: Diagnosis not present

## 2022-02-28 DIAGNOSIS — E785 Hyperlipidemia, unspecified: Secondary | ICD-10-CM | POA: Diagnosis not present

## 2022-03-26 ENCOUNTER — Ambulatory Visit: Payer: Medicare Other | Admitting: Dermatology

## 2022-04-18 ENCOUNTER — Ambulatory Visit (INDEPENDENT_AMBULATORY_CARE_PROVIDER_SITE_OTHER): Payer: Medicare Other | Admitting: Dermatology

## 2022-04-18 DIAGNOSIS — L570 Actinic keratosis: Secondary | ICD-10-CM | POA: Diagnosis not present

## 2022-04-18 DIAGNOSIS — L820 Inflamed seborrheic keratosis: Secondary | ICD-10-CM | POA: Diagnosis not present

## 2022-04-18 DIAGNOSIS — L821 Other seborrheic keratosis: Secondary | ICD-10-CM | POA: Diagnosis not present

## 2022-04-18 DIAGNOSIS — L814 Other melanin hyperpigmentation: Secondary | ICD-10-CM | POA: Diagnosis not present

## 2022-04-18 DIAGNOSIS — D229 Melanocytic nevi, unspecified: Secondary | ICD-10-CM | POA: Diagnosis not present

## 2022-04-18 DIAGNOSIS — Z8589 Personal history of malignant neoplasm of other organs and systems: Secondary | ICD-10-CM

## 2022-04-18 DIAGNOSIS — Z1283 Encounter for screening for malignant neoplasm of skin: Secondary | ICD-10-CM

## 2022-04-18 DIAGNOSIS — Z85828 Personal history of other malignant neoplasm of skin: Secondary | ICD-10-CM | POA: Diagnosis not present

## 2022-04-18 DIAGNOSIS — L578 Other skin changes due to chronic exposure to nonionizing radiation: Secondary | ICD-10-CM

## 2022-04-18 NOTE — Patient Instructions (Addendum)
Seborrheic Keratosis  What causes seborrheic keratoses? Seborrheic keratoses are harmless, common skin growths that first appear during adult life.  As time goes by, more growths appear.  Some people may develop a large number of them.  Seborrheic keratoses appear on both covered and uncovered body parts.  They are not caused by sunlight.  The tendency to develop seborrheic keratoses can be inherited.  They vary in color from skin-colored to gray, brown, or even black.  They can be either smooth or have a rough, warty surface.   Seborrheic keratoses are superficial and look as if they were stuck on the skin.  Under the microscope this type of keratosis looks like layers upon layers of skin.  That is why at times the top layer may seem to fall off, but the rest of the growth remains and re-grows.    Treatment Seborrheic keratoses do not need to be treated, but can easily be removed in the office.  Seborrheic keratoses often cause symptoms when they rub on clothing or jewelry.  Lesions can be in the way of shaving.  If they become inflamed, they can cause itching, soreness, or burning.  Removal of a seborrheic keratosis can be accomplished by freezing, burning, or surgery. If any spot bleeds, scabs, or grows rapidly, please return to have it checked, as these can be an indication of a skin cancer.  Cryotherapy Aftercare  Wash gently with soap and water everyday.   Apply Vaseline and Band-Aid daily until healed.   Actinic keratoses are precancerous spots that appear secondary to cumulative UV radiation exposure/sun exposure over time. They are chronic with expected duration over 1 year. A portion of actinic keratoses will progress to squamous cell carcinoma of the skin. It is not possible to reliably predict which spots will progress to skin cancer and so treatment is recommended to prevent development of skin cancer.  Recommend daily broad spectrum sunscreen SPF 30+ to sun-exposed areas, reapply every  2 hours as needed.  Recommend staying in the shade or wearing long sleeves, sun glasses (UVA+UVB protection) and wide brim hats (4-inch brim around the entire circumference of the hat). Call for new or changing lesions.    Melanoma ABCDEs  Melanoma is the most dangerous type of skin cancer, and is the leading cause of death from skin disease.  You are more likely to develop melanoma if you: Have light-colored skin, light-colored eyes, or red or blond hair Spend a lot of time in the sun Tan regularly, either outdoors or in a tanning bed Have had blistering sunburns, especially during childhood Have a close family member who has had a melanoma Have atypical moles or large birthmarks  Early detection of melanoma is key since treatment is typically straightforward and cure rates are extremely high if we catch it early.   The first sign of melanoma is often a change in a mole or a new dark spot.  The ABCDE system is a way of remembering the signs of melanoma.  A for asymmetry:  The two halves do not match. B for border:  The edges of the growth are irregular. C for color:  A mixture of colors are present instead of an even brown color. D for diameter:  Melanomas are usually (but not always) greater than 6mm - the size of a pencil eraser. E for evolution:  The spot keeps changing in size, shape, and color.  Please check your skin once per month between visits. You can use a small   mirror in front and a large mirror behind you to keep an eye on the back side or your body.   If you see any new or changing lesions before your next follow-up, please call to schedule a visit.  Please continue daily skin protection including broad spectrum sunscreen SPF 30+ to sun-exposed areas, reapplying every 2 hours as needed when you're outdoors.   Staying in the shade or wearing long sleeves, sun glasses (UVA+UVB protection) and wide brim hats (4-inch brim around the entire circumference of the hat) are also  recommended for sun protection.     Due to recent changes in healthcare laws, you may see results of your pathology and/or laboratory studies on MyChart before the doctors have had a chance to review them. We understand that in some cases there may be results that are confusing or concerning to you. Please understand that not all results are received at the same time and often the doctors may need to interpret multiple results in order to provide you with the best plan of care or course of treatment. Therefore, we ask that you please give us 2 business days to thoroughly review all your results before contacting the office for clarification. Should we see a critical lab result, you will be contacted sooner.   If You Need Anything After Your Visit  If you have any questions or concerns for your doctor, please call our main line at [phone] and press option 4 to reach your doctor's medical assistant. If no one answers, please leave a voicemail as directed and we will return your call as soon as possible. Messages left after 4 pm will be answered the following business day.   You may also send us a message via MyChart. We typically respond to MyChart messages within 1-2 business days.  For prescription refills, please ask your pharmacy to contact our office. Our fax number is [phone].  If you have an urgent issue when the clinic is closed that cannot wait until the next business day, you can page your doctor at the number below.    Please note that while we do our best to be available for urgent issues outside of office hours, we are not available 24/7.   If you have an urgent issue and are unable to reach us, you may choose to seek medical care at your doctor's office, retail clinic, urgent care center, or emergency room.  If you have a medical emergency, please immediately call 911 or go to the emergency department.  Pager Numbers  - Dr. Kowalski: [phone]  - Dr. Moye:  [phone]  - Dr. Stewart: [phone]  In the event of inclement weather, please call our main line at [phone] for an update on the status of any delays or closures.  Dermatology Medication Tips: Please keep the boxes that topical medications come in in order to help keep track of the instructions about where and how to use these. Pharmacies typically print the medication instructions only on the boxes and not directly on the medication tubes.   If your medication is too expensive, please contact our office at [phone] option 4 or send us a message through MyChart.   We are unable to tell what your co-pay for medications will be in advance as this is different depending on your insurance coverage. However, we may be able to find a substitute medication at lower cost or fill out paperwork to get insurance to cover a needed medication.   If a prior   authorization is required to get your medication covered by your insurance company, please allow us 1-2 business days to complete this process.  Drug prices often vary depending on where the prescription is filled and some pharmacies may offer cheaper prices.  The website www.goodrx.com contains coupons for medications through different pharmacies. The prices here do not account for what the cost may be with help from insurance (it may be cheaper with your insurance), but the website can give you the price if you did not use any insurance.  - You can print the associated coupon and take it with your prescription to the pharmacy.  - You may also stop by our office during regular business hours and pick up a GoodRx coupon card.  - If you need your prescription sent electronically to a different pharmacy, notify our office through [facility] MyChart or by phone at [phone] option 4.     Si Usted Necesita Algo Despus de Su Visita  Tambin puede enviarnos un mensaje a travs de MyChart. Por lo general respondemos a los mensajes de  MyChart en el transcurso de 1 a 2 das hbiles.  Para renovar recetas, por favor pida a su farmacia que se ponga en contacto con nuestra oficina. Nuestro nmero de fax es el [phone].  Si tiene un asunto urgente cuando la clnica est cerrada y que no puede esperar hasta el siguiente da hbil, puede llamar/localizar a su doctor(a) al nmero que aparece a continuacin.   Por favor, tenga en cuenta que aunque hacemos todo lo posible para estar disponibles para asuntos urgentes fuera del horario de oficina, no estamos disponibles las 24 horas del da, los 7 das de la semana.   Si tiene un problema urgente y no puede comunicarse con nosotros, puede optar por buscar atencin mdica  en el consultorio de su doctor(a), en una clnica privada, en un centro de atencin urgente o en una sala de emergencias.  Si tiene una emergencia mdica, por favor llame inmediatamente al 911 o vaya a la sala de emergencias.  Nmeros de bper  - Dr. Kowalski: [phone]  - Dra. Moye: [phone]  - Dra. Stewart: [phone]  En caso de inclemencias del tiempo, por favor llame a nuestra lnea principal al [phone] para una actualizacin sobre el estado de cualquier retraso o cierre.  Consejos para la medicacin en dermatologa: Por favor, guarde las cajas en las que vienen los medicamentos de uso tpico para ayudarle a seguir las instrucciones sobre dnde y cmo usarlos. Las farmacias generalmente imprimen las instrucciones del medicamento slo en las cajas y no directamente en los tubos del medicamento.   Si su medicamento es muy caro, por favor, pngase en contacto con nuestra oficina llamando al [phone] y presione la opcin 4 o envenos un mensaje a travs de MyChart.   No podemos decirle cul ser su copago por los medicamentos por adelantado ya que esto es diferente dependiendo de la cobertura de su seguro. Sin embargo, es posible que podamos encontrar un medicamento sustituto a menor costo o  llenar un formulario para que el seguro cubra el medicamento que se considera necesario.   Si se requiere una autorizacin previa para que su compaa de seguros cubra su medicamento, por favor permtanos de 1 a 2 das hbiles para completar este proceso.  Los precios de los medicamentos varan con frecuencia dependiendo del lugar de dnde se surte la receta y alguna farmacias pueden ofrecer precios ms baratos.  El sitio web www.goodrx.com tiene cupones para   medicamentos de diferentes farmacias. Los precios aqu no tienen en cuenta lo que podra costar con la ayuda del seguro (puede ser ms barato con su seguro), pero el sitio web puede darle el precio si no utiliz ningn seguro.  - Puede imprimir el cupn correspondiente y llevarlo con su receta a la farmacia.  - Tambin puede pasar por nuestra oficina durante el horario de atencin regular y recoger una tarjeta de cupones de GoodRx.  - Si necesita que su receta se enve electrnicamente a una farmacia diferente, informe a nuestra oficina a travs de MyChart de [place] o por telfono llamando al [phone] y presione la opcin 4.  

## 2022-04-18 NOTE — Progress Notes (Signed)
Follow-Up Visit   Subjective  Ellen Blackburn is a 76 y.o. female who presents for the following: Annual Exam (1 year tbse hx of bcc, hx of isk, hx of aks. ). The patient presents for Total-Body Skin Exam (TBSE) for skin cancer screening and mole check.  The patient has spots, moles and lesions to be evaluated, some may be new or changing and the patient has concerns that these could be cancer.  The following portions of the chart were reviewed this encounter and updated as appropriate:  Tobacco  Allergies  Meds  Problems  Med Hx  Surg Hx  Fam Hx     Review of Systems: No other skin or systemic complaints except as noted in HPI or Assessment and Plan.  Objective  Well appearing patient in no apparent distress; mood and affect are within normal limits.  A full examination was performed including scalp, head, eyes, ears, nose, lips, neck, chest, axillae, abdomen, back, buttocks, bilateral upper extremities, bilateral lower extremities, hands, feet, fingers, toes, fingernails, and toenails. All findings within normal limits unless otherwise noted below.  forehead x 1, scalp x 1 (2) Erythematous thin papules/macules with gritty scale.   right forearm x 2 (2) Erythematous stuck-on, waxy papule or plaque   Assessment & Plan  Actinic keratosis (2) forehead x 1, scalp x 1 Actinic keratoses are precancerous spots that appear secondary to cumulative UV radiation exposure/sun exposure over time. They are chronic with expected duration over 1 year. A portion of actinic keratoses will progress to squamous cell carcinoma of the skin. It is not possible to reliably predict which spots will progress to skin cancer and so treatment is recommended to prevent development of skin cancer.  Recommend daily broad spectrum sunscreen SPF 30+ to sun-exposed areas, reapply every 2 hours as needed.  Recommend staying in the shade or wearing long sleeves, sun glasses (UVA+UVB protection) and wide  brim hats (4-inch brim around the entire circumference of the hat). Call for new or changing lesions.  Destruction of lesion - forehead x 1, scalp x 1 Complexity: simple   Destruction method: cryotherapy   Informed consent: discussed and consent obtained   Timeout:  patient name, date of birth, surgical site, and procedure verified Lesion destroyed using liquid nitrogen: Yes   Region frozen until ice ball extended beyond lesion: Yes   Outcome: patient tolerated procedure well with no complications   Post-procedure details: wound care instructions given   Additional details:  Prior to procedure, discussed risks of blister formation, small wound, skin dyspigmentation, or rare scar following cryotherapy. Recommend Vaseline ointment to treated areas while healing.  Inflamed seborrheic keratosis (2) right forearm x 2 Symptomatic, irritating, patient would like treated. Destruction of lesion - right forearm x 2 Complexity: simple   Destruction method: cryotherapy   Informed consent: discussed and consent obtained   Timeout:  patient name, date of birth, surgical site, and procedure verified Lesion destroyed using liquid nitrogen: Yes   Region frozen until ice ball extended beyond lesion: Yes   Outcome: patient tolerated procedure well with no complications   Post-procedure details: wound care instructions given   Additional details:  Prior to procedure, discussed risks of blister formation, small wound, skin dyspigmentation, or rare scar following cryotherapy. Recommend Vaseline ointment to treated areas while healing.  Lentigines - Scattered tan macules - Due to sun exposure - Benign-appearing, observe - Recommend daily broad spectrum sunscreen SPF 30+ to sun-exposed areas, reapply every 2 hours as needed. -  Call for any changes  Seborrheic Keratoses - Stuck-on, waxy, tan-brown papules and/or plaques  - Benign-appearing - Discussed benign etiology and prognosis. - Observe - Call  for any changes  Melanocytic Nevi - Tan-brown and/or pink-flesh-colored symmetric macules and papules - Benign appearing on exam today - Observation - Call clinic for new or changing moles - Recommend daily use of broad spectrum spf 30+ sunscreen to sun-exposed areas.   Hemangiomas - Red papules - Discussed benign nature - Observe - Call for any changes  Actinic Damage - Chronic condition, secondary to cumulative UV/sun exposure - diffuse scaly erythematous macules with underlying dyspigmentation - Recommend daily broad spectrum sunscreen SPF 30+ to sun-exposed areas, reapply every 2 hours as needed.  - Staying in the shade or wearing long sleeves, sun glasses (UVA+UVB protection) and wide brim hats (4-inch brim around the entire circumference of the hat) are also recommended for sun protection.  - Call for new or changing lesions.  History of Squamous Cell Carcinoma of the Skin 2021 right top of shoulder - No evidence of recurrence today - No lymphadenopathy - Recommend regular full body skin exams - Recommend daily broad spectrum sunscreen SPF 30+ to sun-exposed areas, reapply every 2 hours as needed.  - Call if any new or changing lesions are noted between office visits  Skin cancer screening performed today. Return in about 1 year (around 04/19/2023) for TBSE. IRuthell Rummage, CMA, am acting as scribe for Sarina Ser, MD. Documentation: I have reviewed the above documentation for accuracy and completeness, and I agree with the above.  Sarina Ser, MD

## 2022-04-29 ENCOUNTER — Encounter: Payer: Self-pay | Admitting: Dermatology

## 2022-05-15 DIAGNOSIS — M1990 Unspecified osteoarthritis, unspecified site: Secondary | ICD-10-CM | POA: Diagnosis not present

## 2022-05-15 DIAGNOSIS — E785 Hyperlipidemia, unspecified: Secondary | ICD-10-CM | POA: Diagnosis not present

## 2022-05-15 DIAGNOSIS — J309 Allergic rhinitis, unspecified: Secondary | ICD-10-CM | POA: Diagnosis not present

## 2022-05-15 DIAGNOSIS — M810 Age-related osteoporosis without current pathological fracture: Secondary | ICD-10-CM | POA: Diagnosis not present

## 2022-05-15 DIAGNOSIS — R413 Other amnesia: Secondary | ICD-10-CM | POA: Diagnosis not present

## 2022-05-31 DIAGNOSIS — J018 Other acute sinusitis: Secondary | ICD-10-CM | POA: Diagnosis not present

## 2022-05-31 DIAGNOSIS — R0981 Nasal congestion: Secondary | ICD-10-CM | POA: Diagnosis not present

## 2022-05-31 DIAGNOSIS — J029 Acute pharyngitis, unspecified: Secondary | ICD-10-CM | POA: Diagnosis not present

## 2022-05-31 DIAGNOSIS — Z1152 Encounter for screening for COVID-19: Secondary | ICD-10-CM | POA: Diagnosis not present

## 2022-05-31 DIAGNOSIS — R051 Acute cough: Secondary | ICD-10-CM | POA: Diagnosis not present

## 2022-05-31 DIAGNOSIS — G43909 Migraine, unspecified, not intractable, without status migrainosus: Secondary | ICD-10-CM | POA: Diagnosis not present

## 2022-05-31 DIAGNOSIS — R5383 Other fatigue: Secondary | ICD-10-CM | POA: Diagnosis not present

## 2022-05-31 DIAGNOSIS — M791 Myalgia, unspecified site: Secondary | ICD-10-CM | POA: Diagnosis not present

## 2022-07-07 DIAGNOSIS — X501XXA Overexertion from prolonged static or awkward postures, initial encounter: Secondary | ICD-10-CM | POA: Diagnosis not present

## 2022-07-07 DIAGNOSIS — S6392XA Sprain of unspecified part of left wrist and hand, initial encounter: Secondary | ICD-10-CM | POA: Diagnosis not present

## 2022-07-07 DIAGNOSIS — M25532 Pain in left wrist: Secondary | ICD-10-CM | POA: Diagnosis not present

## 2022-07-17 DIAGNOSIS — Z1231 Encounter for screening mammogram for malignant neoplasm of breast: Secondary | ICD-10-CM | POA: Diagnosis not present

## 2022-07-26 DIAGNOSIS — Z23 Encounter for immunization: Secondary | ICD-10-CM | POA: Diagnosis not present

## 2022-09-04 DIAGNOSIS — X58XXXA Exposure to other specified factors, initial encounter: Secondary | ICD-10-CM | POA: Diagnosis not present

## 2022-09-04 DIAGNOSIS — S6991XA Unspecified injury of right wrist, hand and finger(s), initial encounter: Secondary | ICD-10-CM | POA: Diagnosis not present

## 2022-09-04 DIAGNOSIS — S52571A Other intraarticular fracture of lower end of right radius, initial encounter for closed fracture: Secondary | ICD-10-CM | POA: Diagnosis not present

## 2022-09-11 DIAGNOSIS — S52571A Other intraarticular fracture of lower end of right radius, initial encounter for closed fracture: Secondary | ICD-10-CM | POA: Diagnosis not present

## 2022-09-14 DIAGNOSIS — S52571A Other intraarticular fracture of lower end of right radius, initial encounter for closed fracture: Secondary | ICD-10-CM | POA: Diagnosis not present

## 2022-09-14 DIAGNOSIS — X58XXXA Exposure to other specified factors, initial encounter: Secondary | ICD-10-CM | POA: Diagnosis not present

## 2022-09-14 DIAGNOSIS — Y999 Unspecified external cause status: Secondary | ICD-10-CM | POA: Diagnosis not present

## 2022-09-24 DIAGNOSIS — S52571A Other intraarticular fracture of lower end of right radius, initial encounter for closed fracture: Secondary | ICD-10-CM | POA: Diagnosis not present

## 2022-09-24 DIAGNOSIS — S52571D Other intraarticular fracture of lower end of right radius, subsequent encounter for closed fracture with routine healing: Secondary | ICD-10-CM | POA: Diagnosis not present

## 2022-10-10 DIAGNOSIS — H25013 Cortical age-related cataract, bilateral: Secondary | ICD-10-CM | POA: Diagnosis not present

## 2022-10-10 DIAGNOSIS — H2513 Age-related nuclear cataract, bilateral: Secondary | ICD-10-CM | POA: Diagnosis not present

## 2022-10-16 DIAGNOSIS — S52501D Unspecified fracture of the lower end of right radius, subsequent encounter for closed fracture with routine healing: Secondary | ICD-10-CM | POA: Diagnosis not present

## 2022-10-23 DIAGNOSIS — R413 Other amnesia: Secondary | ICD-10-CM | POA: Diagnosis not present

## 2022-10-23 DIAGNOSIS — R7989 Other specified abnormal findings of blood chemistry: Secondary | ICD-10-CM | POA: Diagnosis not present

## 2022-10-23 DIAGNOSIS — E785 Hyperlipidemia, unspecified: Secondary | ICD-10-CM | POA: Diagnosis not present

## 2022-10-24 DIAGNOSIS — Z1212 Encounter for screening for malignant neoplasm of rectum: Secondary | ICD-10-CM | POA: Diagnosis not present

## 2022-10-30 DIAGNOSIS — Z23 Encounter for immunization: Secondary | ICD-10-CM | POA: Diagnosis not present

## 2022-10-30 DIAGNOSIS — Z0000 Encounter for general adult medical examination without abnormal findings: Secondary | ICD-10-CM | POA: Diagnosis not present

## 2022-10-30 DIAGNOSIS — Z1339 Encounter for screening examination for other mental health and behavioral disorders: Secondary | ICD-10-CM | POA: Diagnosis not present

## 2022-10-30 DIAGNOSIS — J309 Allergic rhinitis, unspecified: Secondary | ICD-10-CM | POA: Diagnosis not present

## 2022-10-30 DIAGNOSIS — E785 Hyperlipidemia, unspecified: Secondary | ICD-10-CM | POA: Diagnosis not present

## 2022-10-30 DIAGNOSIS — M1990 Unspecified osteoarthritis, unspecified site: Secondary | ICD-10-CM | POA: Diagnosis not present

## 2022-10-30 DIAGNOSIS — R413 Other amnesia: Secondary | ICD-10-CM | POA: Diagnosis not present

## 2022-10-30 DIAGNOSIS — S52501D Unspecified fracture of the lower end of right radius, subsequent encounter for closed fracture with routine healing: Secondary | ICD-10-CM | POA: Diagnosis not present

## 2022-10-30 DIAGNOSIS — M810 Age-related osteoporosis without current pathological fracture: Secondary | ICD-10-CM | POA: Diagnosis not present

## 2022-10-30 DIAGNOSIS — Z1331 Encounter for screening for depression: Secondary | ICD-10-CM | POA: Diagnosis not present

## 2022-11-30 DIAGNOSIS — S52501D Unspecified fracture of the lower end of right radius, subsequent encounter for closed fracture with routine healing: Secondary | ICD-10-CM | POA: Diagnosis not present

## 2022-12-06 DIAGNOSIS — M25531 Pain in right wrist: Secondary | ICD-10-CM | POA: Diagnosis not present

## 2022-12-06 DIAGNOSIS — M25631 Stiffness of right wrist, not elsewhere classified: Secondary | ICD-10-CM | POA: Diagnosis not present

## 2022-12-06 DIAGNOSIS — S52571D Other intraarticular fracture of lower end of right radius, subsequent encounter for closed fracture with routine healing: Secondary | ICD-10-CM | POA: Diagnosis not present

## 2022-12-06 DIAGNOSIS — S52501D Unspecified fracture of the lower end of right radius, subsequent encounter for closed fracture with routine healing: Secondary | ICD-10-CM | POA: Diagnosis not present

## 2022-12-17 DIAGNOSIS — M25531 Pain in right wrist: Secondary | ICD-10-CM | POA: Diagnosis not present

## 2022-12-17 DIAGNOSIS — M25631 Stiffness of right wrist, not elsewhere classified: Secondary | ICD-10-CM | POA: Diagnosis not present

## 2022-12-17 DIAGNOSIS — S52501D Unspecified fracture of the lower end of right radius, subsequent encounter for closed fracture with routine healing: Secondary | ICD-10-CM | POA: Diagnosis not present

## 2022-12-17 DIAGNOSIS — S52571D Other intraarticular fracture of lower end of right radius, subsequent encounter for closed fracture with routine healing: Secondary | ICD-10-CM | POA: Diagnosis not present

## 2022-12-24 DIAGNOSIS — M25631 Stiffness of right wrist, not elsewhere classified: Secondary | ICD-10-CM | POA: Diagnosis not present

## 2022-12-24 DIAGNOSIS — S52571D Other intraarticular fracture of lower end of right radius, subsequent encounter for closed fracture with routine healing: Secondary | ICD-10-CM | POA: Diagnosis not present

## 2022-12-24 DIAGNOSIS — S52501D Unspecified fracture of the lower end of right radius, subsequent encounter for closed fracture with routine healing: Secondary | ICD-10-CM | POA: Diagnosis not present

## 2022-12-24 DIAGNOSIS — M25531 Pain in right wrist: Secondary | ICD-10-CM | POA: Diagnosis not present

## 2022-12-31 DIAGNOSIS — M25531 Pain in right wrist: Secondary | ICD-10-CM | POA: Diagnosis not present

## 2022-12-31 DIAGNOSIS — S52571D Other intraarticular fracture of lower end of right radius, subsequent encounter for closed fracture with routine healing: Secondary | ICD-10-CM | POA: Diagnosis not present

## 2022-12-31 DIAGNOSIS — M25631 Stiffness of right wrist, not elsewhere classified: Secondary | ICD-10-CM | POA: Diagnosis not present

## 2022-12-31 DIAGNOSIS — S52501D Unspecified fracture of the lower end of right radius, subsequent encounter for closed fracture with routine healing: Secondary | ICD-10-CM | POA: Diagnosis not present

## 2023-01-07 DIAGNOSIS — S52501D Unspecified fracture of the lower end of right radius, subsequent encounter for closed fracture with routine healing: Secondary | ICD-10-CM | POA: Diagnosis not present

## 2023-01-07 DIAGNOSIS — M25631 Stiffness of right wrist, not elsewhere classified: Secondary | ICD-10-CM | POA: Diagnosis not present

## 2023-01-07 DIAGNOSIS — S52571D Other intraarticular fracture of lower end of right radius, subsequent encounter for closed fracture with routine healing: Secondary | ICD-10-CM | POA: Diagnosis not present

## 2023-01-07 DIAGNOSIS — M25531 Pain in right wrist: Secondary | ICD-10-CM | POA: Diagnosis not present

## 2023-01-08 DIAGNOSIS — S52571D Other intraarticular fracture of lower end of right radius, subsequent encounter for closed fracture with routine healing: Secondary | ICD-10-CM | POA: Diagnosis not present

## 2023-01-08 DIAGNOSIS — M778 Other enthesopathies, not elsewhere classified: Secondary | ICD-10-CM | POA: Diagnosis not present

## 2023-01-11 DIAGNOSIS — Z23 Encounter for immunization: Secondary | ICD-10-CM | POA: Diagnosis not present

## 2023-01-17 DIAGNOSIS — S52501D Unspecified fracture of the lower end of right radius, subsequent encounter for closed fracture with routine healing: Secondary | ICD-10-CM | POA: Diagnosis not present

## 2023-01-17 DIAGNOSIS — M25531 Pain in right wrist: Secondary | ICD-10-CM | POA: Diagnosis not present

## 2023-01-17 DIAGNOSIS — S52571D Other intraarticular fracture of lower end of right radius, subsequent encounter for closed fracture with routine healing: Secondary | ICD-10-CM | POA: Diagnosis not present

## 2023-01-17 DIAGNOSIS — M25631 Stiffness of right wrist, not elsewhere classified: Secondary | ICD-10-CM | POA: Diagnosis not present

## 2023-01-17 DIAGNOSIS — Z4889 Encounter for other specified surgical aftercare: Secondary | ICD-10-CM | POA: Diagnosis not present

## 2023-01-23 DIAGNOSIS — M25531 Pain in right wrist: Secondary | ICD-10-CM | POA: Diagnosis not present

## 2023-01-23 DIAGNOSIS — S52501D Unspecified fracture of the lower end of right radius, subsequent encounter for closed fracture with routine healing: Secondary | ICD-10-CM | POA: Diagnosis not present

## 2023-01-23 DIAGNOSIS — M25631 Stiffness of right wrist, not elsewhere classified: Secondary | ICD-10-CM | POA: Diagnosis not present

## 2023-01-23 DIAGNOSIS — S52571D Other intraarticular fracture of lower end of right radius, subsequent encounter for closed fracture with routine healing: Secondary | ICD-10-CM | POA: Diagnosis not present

## 2023-01-30 DIAGNOSIS — M25631 Stiffness of right wrist, not elsewhere classified: Secondary | ICD-10-CM | POA: Diagnosis not present

## 2023-01-30 DIAGNOSIS — M25531 Pain in right wrist: Secondary | ICD-10-CM | POA: Diagnosis not present

## 2023-01-30 DIAGNOSIS — S52571D Other intraarticular fracture of lower end of right radius, subsequent encounter for closed fracture with routine healing: Secondary | ICD-10-CM | POA: Diagnosis not present

## 2023-01-30 DIAGNOSIS — S52501D Unspecified fracture of the lower end of right radius, subsequent encounter for closed fracture with routine healing: Secondary | ICD-10-CM | POA: Diagnosis not present

## 2023-02-15 DIAGNOSIS — M25531 Pain in right wrist: Secondary | ICD-10-CM | POA: Diagnosis not present

## 2023-02-15 DIAGNOSIS — S52501D Unspecified fracture of the lower end of right radius, subsequent encounter for closed fracture with routine healing: Secondary | ICD-10-CM | POA: Diagnosis not present

## 2023-02-15 DIAGNOSIS — M25631 Stiffness of right wrist, not elsewhere classified: Secondary | ICD-10-CM | POA: Diagnosis not present

## 2023-02-15 DIAGNOSIS — S52571D Other intraarticular fracture of lower end of right radius, subsequent encounter for closed fracture with routine healing: Secondary | ICD-10-CM | POA: Diagnosis not present

## 2023-04-09 DIAGNOSIS — S52571D Other intraarticular fracture of lower end of right radius, subsequent encounter for closed fracture with routine healing: Secondary | ICD-10-CM | POA: Diagnosis not present

## 2023-04-09 DIAGNOSIS — M778 Other enthesopathies, not elsewhere classified: Secondary | ICD-10-CM | POA: Diagnosis not present

## 2023-04-16 DIAGNOSIS — M1990 Unspecified osteoarthritis, unspecified site: Secondary | ICD-10-CM | POA: Diagnosis not present

## 2023-04-16 DIAGNOSIS — M810 Age-related osteoporosis without current pathological fracture: Secondary | ICD-10-CM | POA: Diagnosis not present

## 2023-04-16 DIAGNOSIS — S52501D Unspecified fracture of the lower end of right radius, subsequent encounter for closed fracture with routine healing: Secondary | ICD-10-CM | POA: Diagnosis not present

## 2023-04-16 DIAGNOSIS — E785 Hyperlipidemia, unspecified: Secondary | ICD-10-CM | POA: Diagnosis not present

## 2023-04-16 DIAGNOSIS — R413 Other amnesia: Secondary | ICD-10-CM | POA: Diagnosis not present

## 2023-04-16 DIAGNOSIS — J309 Allergic rhinitis, unspecified: Secondary | ICD-10-CM | POA: Diagnosis not present

## 2023-04-19 ENCOUNTER — Other Ambulatory Visit (HOSPITAL_COMMUNITY): Payer: Self-pay

## 2023-04-22 ENCOUNTER — Encounter (HOSPITAL_COMMUNITY): Payer: Self-pay

## 2023-04-22 ENCOUNTER — Encounter (HOSPITAL_COMMUNITY): Payer: Medicare Other

## 2023-04-24 ENCOUNTER — Ambulatory Visit: Payer: Medicare Other | Admitting: Dermatology

## 2023-07-11 DIAGNOSIS — M25561 Pain in right knee: Secondary | ICD-10-CM | POA: Diagnosis not present

## 2023-07-11 DIAGNOSIS — W010XXA Fall on same level from slipping, tripping and stumbling without subsequent striking against object, initial encounter: Secondary | ICD-10-CM | POA: Diagnosis not present

## 2023-07-18 DIAGNOSIS — M25561 Pain in right knee: Secondary | ICD-10-CM | POA: Diagnosis not present

## 2023-07-24 DIAGNOSIS — R051 Acute cough: Secondary | ICD-10-CM | POA: Diagnosis not present

## 2023-07-24 DIAGNOSIS — J029 Acute pharyngitis, unspecified: Secondary | ICD-10-CM | POA: Diagnosis not present

## 2023-07-24 DIAGNOSIS — J309 Allergic rhinitis, unspecified: Secondary | ICD-10-CM | POA: Diagnosis not present

## 2023-07-24 DIAGNOSIS — R5383 Other fatigue: Secondary | ICD-10-CM | POA: Diagnosis not present

## 2023-07-24 DIAGNOSIS — R0981 Nasal congestion: Secondary | ICD-10-CM | POA: Diagnosis not present

## 2023-07-24 DIAGNOSIS — Z1152 Encounter for screening for COVID-19: Secondary | ICD-10-CM | POA: Diagnosis not present

## 2023-08-23 DIAGNOSIS — Z1231 Encounter for screening mammogram for malignant neoplasm of breast: Secondary | ICD-10-CM | POA: Diagnosis not present

## 2023-08-28 ENCOUNTER — Ambulatory Visit: Payer: Medicare Other | Admitting: Dermatology

## 2023-09-11 DIAGNOSIS — G43909 Migraine, unspecified, not intractable, without status migrainosus: Secondary | ICD-10-CM | POA: Diagnosis not present

## 2023-09-11 DIAGNOSIS — J309 Allergic rhinitis, unspecified: Secondary | ICD-10-CM | POA: Diagnosis not present

## 2023-09-11 DIAGNOSIS — R059 Cough, unspecified: Secondary | ICD-10-CM | POA: Diagnosis not present

## 2023-09-11 DIAGNOSIS — Z1152 Encounter for screening for COVID-19: Secondary | ICD-10-CM | POA: Diagnosis not present

## 2023-09-11 DIAGNOSIS — R0981 Nasal congestion: Secondary | ICD-10-CM | POA: Diagnosis not present

## 2023-09-19 ENCOUNTER — Ambulatory Visit: Admitting: Dermatology

## 2023-10-08 ENCOUNTER — Encounter: Payer: Self-pay | Admitting: Dermatology

## 2023-10-08 ENCOUNTER — Ambulatory Visit: Admitting: Dermatology

## 2023-10-08 DIAGNOSIS — L820 Inflamed seborrheic keratosis: Secondary | ICD-10-CM

## 2023-10-08 DIAGNOSIS — L814 Other melanin hyperpigmentation: Secondary | ICD-10-CM

## 2023-10-08 DIAGNOSIS — L570 Actinic keratosis: Secondary | ICD-10-CM | POA: Diagnosis not present

## 2023-10-08 DIAGNOSIS — Z8589 Personal history of malignant neoplasm of other organs and systems: Secondary | ICD-10-CM

## 2023-10-08 DIAGNOSIS — Z85828 Personal history of other malignant neoplasm of skin: Secondary | ICD-10-CM | POA: Diagnosis not present

## 2023-10-08 DIAGNOSIS — D229 Melanocytic nevi, unspecified: Secondary | ICD-10-CM

## 2023-10-08 DIAGNOSIS — W908XXA Exposure to other nonionizing radiation, initial encounter: Secondary | ICD-10-CM | POA: Diagnosis not present

## 2023-10-08 DIAGNOSIS — L821 Other seborrheic keratosis: Secondary | ICD-10-CM | POA: Diagnosis not present

## 2023-10-08 DIAGNOSIS — L578 Other skin changes due to chronic exposure to nonionizing radiation: Secondary | ICD-10-CM

## 2023-10-08 DIAGNOSIS — D1801 Hemangioma of skin and subcutaneous tissue: Secondary | ICD-10-CM

## 2023-10-08 DIAGNOSIS — Z1283 Encounter for screening for malignant neoplasm of skin: Secondary | ICD-10-CM | POA: Diagnosis not present

## 2023-10-08 NOTE — Patient Instructions (Addendum)

## 2023-10-08 NOTE — Progress Notes (Signed)
 Follow-Up Visit   Subjective  Ellen Blackburn is a 78 y.o. female who presents for the following: Skin Cancer Screening and Upper Body Skin Exam, hx of SCC  The patient presents for Upper Body Skin Exam (UBSE) for skin cancer screening and mole check. The patient has spots, moles and lesions to be evaluated, some may be new or changing and the patient may have concern these could be cancer.  The following portions of the chart were reviewed this encounter and updated as appropriate: medications, allergies, medical history  Review of Systems:  No other skin or systemic complaints except as noted in HPI or Assessment and Plan.  Objective  Well appearing patient in no apparent distress; mood and affect are within normal limits.  All skin waist up examined. Relevant physical exam findings are noted in the Assessment and Plan.  upper back spinal, right forearm, right shoulder, right temple (4) Stuck-on, waxy, tan-brown papules and plaques -- Discussed benign etiology and prognosis.  scalp, forehead, right ear (3) Erythematous thin papules/macules with gritty scale.   Assessment & Plan   INFLAMED SEBORRHEIC KERATOSIS (4) upper back spinal, right forearm, right shoulder, right temple (4) Symptomatic, irritating, patient would like treated.  Destruction of lesion - upper back spinal, right forearm, right shoulder, right temple (4) Complexity: simple   Destruction method: cryotherapy   Informed consent: discussed and consent obtained   Timeout:  patient name, date of birth, surgical site, and procedure verified Lesion destroyed using liquid nitrogen: Yes   Region frozen until ice ball extended beyond lesion: Yes   Outcome: patient tolerated procedure well with no complications   Post-procedure details: wound care instructions given   AK (ACTINIC KERATOSIS) (3) scalp, forehead, right ear (3) ACTINIC DAMAGE - chronic, secondary to cumulative UV radiation exposure/sun exposure  over time - diffuse scaly erythematous macules with underlying dyspigmentation - Recommend daily broad spectrum sunscreen SPF 30+ to sun-exposed areas, reapply every 2 hours as needed.  - Recommend staying in the shade or wearing long sleeves, sun glasses (UVA+UVB protection) and wide brim hats (4-inch brim around the entire circumference of the hat). - Call for new or changing lesions.  Destruction of lesion - scalp, forehead, right ear (3) Complexity: simple   Destruction method: cryotherapy   Informed consent: discussed and consent obtained   Timeout:  patient name, date of birth, surgical site, and procedure verified Lesion destroyed using liquid nitrogen: Yes   Region frozen until ice ball extended beyond lesion: Yes   Outcome: patient tolerated procedure well with no complications   Post-procedure details: wound care instructions given     Skin cancer screening performed today.   Lentigines, Seborrheic Keratoses, Hemangiomas - Benign normal skin lesions - Benign-appearing - Call for any changes  Melanocytic Nevi - Tan-brown and/or pink-flesh-colored symmetric macules and papules - Benign appearing on exam today - Observation - Call clinic for new or changing moles - Recommend daily use of broad spectrum spf 30+ sunscreen to sun-exposed areas.   HISTORY OF SQUAMOUS CELL CARCINOMA OF THE SKIN - No evidence of recurrence today - No lymphadenopathy - Recommend regular full body skin exams - Recommend daily broad spectrum sunscreen SPF 30+ to sun-exposed areas, reapply every 2 hours as needed.  - Call if any new or changing lesions are noted between office visits   Return in about 1 year (around 10/07/2024) for TBSE, hx of SCC.  IClara Crisp, CMA, am acting as scribe for Celine Collard, MD .  Documentation: I have reviewed the above documentation for accuracy and completeness, and I agree with the above.  Celine Collard, MD

## 2023-10-30 DIAGNOSIS — E785 Hyperlipidemia, unspecified: Secondary | ICD-10-CM | POA: Diagnosis not present

## 2023-10-30 DIAGNOSIS — M810 Age-related osteoporosis without current pathological fracture: Secondary | ICD-10-CM | POA: Diagnosis not present

## 2023-10-30 DIAGNOSIS — Z1212 Encounter for screening for malignant neoplasm of rectum: Secondary | ICD-10-CM | POA: Diagnosis not present

## 2023-10-30 DIAGNOSIS — R7989 Other specified abnormal findings of blood chemistry: Secondary | ICD-10-CM | POA: Diagnosis not present

## 2023-11-06 DIAGNOSIS — J309 Allergic rhinitis, unspecified: Secondary | ICD-10-CM | POA: Diagnosis not present

## 2023-11-06 DIAGNOSIS — R82998 Other abnormal findings in urine: Secondary | ICD-10-CM | POA: Diagnosis not present

## 2023-11-06 DIAGNOSIS — E785 Hyperlipidemia, unspecified: Secondary | ICD-10-CM | POA: Diagnosis not present

## 2023-11-06 DIAGNOSIS — F0150 Vascular dementia without behavioral disturbance: Secondary | ICD-10-CM | POA: Diagnosis not present

## 2023-11-06 DIAGNOSIS — Z0000 Encounter for general adult medical examination without abnormal findings: Secondary | ICD-10-CM | POA: Diagnosis not present

## 2023-11-06 DIAGNOSIS — S52501D Unspecified fracture of the lower end of right radius, subsequent encounter for closed fracture with routine healing: Secondary | ICD-10-CM | POA: Diagnosis not present

## 2023-11-06 DIAGNOSIS — M810 Age-related osteoporosis without current pathological fracture: Secondary | ICD-10-CM | POA: Diagnosis not present

## 2023-11-06 DIAGNOSIS — M1990 Unspecified osteoarthritis, unspecified site: Secondary | ICD-10-CM | POA: Diagnosis not present

## 2023-11-06 DIAGNOSIS — Z1331 Encounter for screening for depression: Secondary | ICD-10-CM | POA: Diagnosis not present

## 2023-11-06 DIAGNOSIS — Z1339 Encounter for screening examination for other mental health and behavioral disorders: Secondary | ICD-10-CM | POA: Diagnosis not present

## 2023-11-06 DIAGNOSIS — R739 Hyperglycemia, unspecified: Secondary | ICD-10-CM | POA: Diagnosis not present

## 2024-01-29 DIAGNOSIS — Z23 Encounter for immunization: Secondary | ICD-10-CM | POA: Diagnosis not present

## 2024-10-08 ENCOUNTER — Encounter: Admitting: Dermatology
